# Patient Record
Sex: Female | Born: 1950
Health system: Southern US, Community
[De-identification: ages and names within clinical notes are randomized; demographics above are authoritative.]

## PROBLEM LIST (undated history)

## (undated) DIAGNOSIS — C189 Malignant neoplasm of colon, unspecified: Secondary | ICD-10-CM

## (undated) DIAGNOSIS — E119 Type 2 diabetes mellitus without complications: Secondary | ICD-10-CM

## (undated) DIAGNOSIS — I10 Essential (primary) hypertension: Secondary | ICD-10-CM

## (undated) DIAGNOSIS — E785 Hyperlipidemia, unspecified: Secondary | ICD-10-CM

## (undated) DIAGNOSIS — R002 Palpitations: Secondary | ICD-10-CM

## (undated) HISTORY — DX: Hyperlipidemia, unspecified: E78.5

## (undated) HISTORY — DX: Palpitations: R00.2

## (undated) HISTORY — PX: ABDOMINAL HYSTERECTOMY: SHX81

## (undated) HISTORY — DX: Type 2 diabetes mellitus without complications: E11.9

---

## 2007-08-14 HISTORY — PX: COLON SURGERY: SHX602

## 2007-08-14 HISTORY — PX: ABDOMINAL HERNIA REPAIR: SHX539

## 2009-02-28 ENCOUNTER — Emergency Department (HOSPITAL_COMMUNITY): Admission: EM | Admit: 2009-02-28 | Discharge: 2009-03-01 | Payer: Self-pay | Admitting: Emergency Medicine

## 2009-03-18 ENCOUNTER — Ambulatory Visit (HOSPITAL_COMMUNITY): Admission: RE | Admit: 2009-03-18 | Discharge: 2009-03-18 | Payer: Self-pay | Admitting: Obstetrics and Gynecology

## 2009-07-01 ENCOUNTER — Ambulatory Visit (HOSPITAL_COMMUNITY): Admission: RE | Admit: 2009-07-01 | Discharge: 2009-07-01 | Payer: Self-pay | Admitting: Obstetrics and Gynecology

## 2010-11-19 LAB — URINALYSIS, ROUTINE W REFLEX MICROSCOPIC
Glucose, UA: NEGATIVE mg/dL
Hgb urine dipstick: NEGATIVE
Ketones, ur: NEGATIVE mg/dL
Protein, ur: NEGATIVE mg/dL

## 2010-11-19 LAB — COMPREHENSIVE METABOLIC PANEL
Albumin: 3.9 g/dL (ref 3.5–5.2)
Alkaline Phosphatase: 59 U/L (ref 39–117)
BUN: 13 mg/dL (ref 6–23)
CO2: 25 mEq/L (ref 19–32)
Chloride: 104 mEq/L (ref 96–112)
Glucose, Bld: 102 mg/dL — ABNORMAL HIGH (ref 70–99)
Potassium: 3.7 mEq/L (ref 3.5–5.1)
Total Bilirubin: 0.5 mg/dL (ref 0.3–1.2)

## 2010-11-19 LAB — DIFFERENTIAL
Basophils Absolute: 0 10*3/uL (ref 0.0–0.1)
Basophils Relative: 0 % (ref 0–1)
Monocytes Absolute: 0.4 10*3/uL (ref 0.1–1.0)
Neutro Abs: 4.7 10*3/uL (ref 1.7–7.7)
Neutrophils Relative %: 70 % (ref 43–77)

## 2010-11-19 LAB — CBC
HCT: 40.3 % (ref 36.0–46.0)
Hemoglobin: 13.6 g/dL (ref 12.0–15.0)
RBC: 4.59 MIL/uL (ref 3.87–5.11)
WBC: 6.8 10*3/uL (ref 4.0–10.5)

## 2010-11-19 LAB — URINE MICROSCOPIC-ADD ON

## 2012-11-30 ENCOUNTER — Emergency Department (HOSPITAL_COMMUNITY): Payer: Medicare Other

## 2012-11-30 ENCOUNTER — Emergency Department (HOSPITAL_COMMUNITY)
Admission: EM | Admit: 2012-11-30 | Discharge: 2012-11-30 | Disposition: A | Discharge status: Home / Self Care | Payer: Medicare Other | Attending: Emergency Medicine | Admitting: Emergency Medicine

## 2012-11-30 ENCOUNTER — Encounter (HOSPITAL_COMMUNITY): Payer: Self-pay | Admitting: Emergency Medicine

## 2012-11-30 DIAGNOSIS — E876 Hypokalemia: Secondary | ICD-10-CM

## 2012-11-30 DIAGNOSIS — R42 Dizziness and giddiness: Secondary | ICD-10-CM | POA: Insufficient documentation

## 2012-11-30 DIAGNOSIS — I951 Orthostatic hypotension: Secondary | ICD-10-CM | POA: Insufficient documentation

## 2012-11-30 HISTORY — DX: Essential (primary) hypertension: I10

## 2012-11-30 HISTORY — DX: Malignant neoplasm of colon, unspecified: C18.9

## 2012-11-30 LAB — CBC WITH DIFFERENTIAL/PLATELET
Basophils Relative: 0 % (ref 0–1)
Eosinophils Absolute: 0 10*3/uL (ref 0.0–0.7)
Lymphs Abs: 1.9 10*3/uL (ref 0.7–4.0)
MCH: 29.6 pg (ref 26.0–34.0)
Neutrophils Relative %: 71 % (ref 43–77)
Platelets: 412 10*3/uL — ABNORMAL HIGH (ref 150–400)
RBC: 4.36 MIL/uL (ref 3.87–5.11)

## 2012-11-30 LAB — BASIC METABOLIC PANEL
GFR calc Af Amer: 74 mL/min — ABNORMAL LOW (ref >90)
GFR calc non Af Amer: 64 mL/min — ABNORMAL LOW (ref >90)
Potassium: 3 mEq/L — ABNORMAL LOW (ref 3.5–5.1)
Sodium: 133 mEq/L — ABNORMAL LOW (ref 135–145)

## 2012-11-30 LAB — URINALYSIS, ROUTINE W REFLEX MICROSCOPIC
Nitrite: NEGATIVE
Specific Gravity, Urine: 1.007 (ref 1.005–1.030)
Urobilinogen, UA: 0.2 mg/dL (ref 0.0–1.0)

## 2012-11-30 LAB — URINE MICROSCOPIC-ADD ON

## 2012-11-30 LAB — GLUCOSE, CAPILLARY: Glucose-Capillary: 115 mg/dL — ABNORMAL HIGH (ref 70–99)

## 2012-11-30 MED ORDER — SODIUM CHLORIDE 0.9 % IV BOLUS (SEPSIS)
1000.0000 mL | Freq: Once | INTRAVENOUS | Status: AC
  Administered 2012-11-30: 1000 mL via INTRAVENOUS

## 2012-11-30 MED ORDER — POTASSIUM CHLORIDE CRYS ER 20 MEQ PO TBCR
40.0000 meq | EXTENDED_RELEASE_TABLET | Freq: Once | ORAL | Status: AC
  Administered 2012-11-30: 40 meq via ORAL
  Filled 2012-11-30: qty 2

## 2012-11-30 MED ORDER — ACETAMINOPHEN 325 MG PO TABS
650.0000 mg | ORAL_TABLET | Freq: Once | ORAL | Status: AC
  Administered 2012-11-30: 650 mg via ORAL
  Filled 2012-11-30: qty 2

## 2012-11-30 NOTE — ED Notes (Signed)
Lab called, no label on urinalysis, need to re-collect

## 2012-11-30 NOTE — ED Notes (Signed)
Urine sent to lab 

## 2012-11-30 NOTE — ED Provider Notes (Signed)
History     CSN: [id number]  Arrival date & time 11/30/12  1136   First MD Initiated Contact with Patient 11/30/12 1138      No chief complaint on file.   (Consider location/radiation/quality/duration/timing/severity/associated sxs/prior treatment) HPI  62 year old female presents complaining of dizziness and heart palpitation. Patient reports she has a history of colon cancer and history of hypertension. Last week she was diagnosed with having angioedema secondary to lisinopril use. Her lisinopril was discontinued and she was switched to a diuretic pill, triamterine-HCTZ 37.5-25 PO once daily.  She reports she has been taking this medication for the past 5 days. She reports feeling lightheadedness and nearly passed out this morning when trying to get out of bed. Reports tingling sensation throughout her body, having heart palpitation with significant lightheadedness. Symptoms improved when she lies flat. Patient also reports having some mild burning urination and also having increased urinary frequency urgency. States she urinates a lot, but this is normal for her. She reported she has diabetes that are managed with diet only.  She denies loss of consciousness. Endorse nausea without vomiting or diarrhea. No chest pain, short of breath, abdominal pain, or rash.  No past medical history on file.  No past surgical history on file.  No family history on file.  History  Substance Use Topics  . Smoking status: Not on file  . Smokeless tobacco: Not on file  . Alcohol Use: Not on file    OB History   No data available      Review of Systems  Constitutional:       10 Systems reviewed and all are negative for acute change except as noted in the HPI.     Allergies  Review of patient's allergies indicates not on file.  Home Medications  No current outpatient prescriptions on file.  There were no vitals taken for this visit.  Physical Exam  Nursing note and vitals  reviewed. Constitutional: She is oriented to person, place, and time. She appears well-developed and well-nourished. No distress.  Awake, alert, nontoxic appearance  HENT:  Head: Normocephalic and atraumatic.  Mouth/Throat: Oropharynx is clear and moist.  Eyes: Conjunctivae are normal. Right eye exhibits no discharge. Left eye exhibits no discharge.  Neck: Normal range of motion. Neck supple. No JVD present.  Cardiovascular: Normal rate, regular rhythm and intact distal pulses.  Exam reveals no gallop and no friction rub.   No murmur heard. Pulmonary/Chest: Effort normal. No respiratory distress. She exhibits no tenderness.  Abdominal: Soft. There is no tenderness. There is no rebound.  Well-healing midline abdominal surgical scar, no hernia noted, nontender on palpation.  Musculoskeletal: She exhibits no edema and no tenderness.  ROM appears intact, no obvious focal weakness  Neurological: She is alert and oriented to person, place, and time. She has normal strength. No cranial nerve deficit or sensory deficit. GCS eye subscore is 4. GCS verbal subscore is 5. GCS motor subscore is 6.  Mental status and motor strength appears intact  Skin: No rash noted.  Psychiatric: She has a normal mood and affect.    ED Course  Procedures (including critical care time)   Date: 11/30/2012  Rate: 75  Rhythm: normal sinus rhythm  QRS Axis: left  Intervals: normal  ST/T Wave abnormalities: normal  Conduction Disutrbances:none  Narrative Interpretation:   Old EKG Reviewed: none available    12:13 PM Patient presents with near syncope episode, and heart palpitation. Her current blood pressure is 86/59. Symptoms worsened with  positional change, likely orthostatic hypotension. She has been taking a diuretic pill, which may likely precipitate the near syncope. No vertiginous complaint. Patient has no focal neuro deficit. No active chest pain or shortness of breath. Workup initiated.  1:31 PM BP  improves after IV hydration.  K+ 3.0 today, supplementation given.  Otherwise ECG, troponin, Hgb, CBG and BNP are all normal.  Care discussed with attending.  Plan to continue hydration until pt is back to baseline, able to ambulate.  Will d/c her diuretic and have pt have close f/u with PCP for further management.    Labs Reviewed  CBC WITH DIFFERENTIAL - Abnormal; Notable for the following:    Platelets 412 (*)    All other components within normal limits  BASIC METABOLIC PANEL - Abnormal; Notable for the following:    Sodium 133 (*)    Potassium 3.0 (*)    Chloride 95 (*)    Glucose, Bld 113 (*)    GFR calc non Af Amer 64 (*)    GFR calc Af Amer 74 (*)    All other components within normal limits  URINALYSIS, ROUTINE W REFLEX MICROSCOPIC - Abnormal; Notable for the following:    Leukocytes, UA SMALL (*)    All other components within normal limits  GLUCOSE, CAPILLARY - Abnormal; Notable for the following:    Glucose-Capillary 115 (*)    All other components within normal limits  PRO B NATRIURETIC PEPTIDE  URINE MICROSCOPIC-ADD ON  POCT I-STAT TROPONIN I   Dg Chest 2 View  11/30/2012  *RADIOLOGY REPORT*  Clinical Data:  Heart palpitations, near-syncope  CHEST - 2 VIEW  Comparison: None.  Findings: The lungs are well-aerated and free from pulmonary edema, focal airspace consolidation or pulmonary nodule.  Cardiac and mediastinal contours are within normal limits.  No pneumothorax, or pleural effusion. No acute osseous findings. Helical surgical tacks in the upper abdomen suggest prior laparoscopic ventral hernia repair.  IMPRESSION:  No acute cardiopulmonary disease.   Original Report Authenticated By: Malachy Moan, M.D.      1. Orthostatic hypotension   2. Hypokalemia       MDM  BP 129/56  Pulse 72  Temp(Src) 99.2 F (37.3 C) (Oral)  Resp 24  SpO2 100%  I have reviewed nursing notes and vital signs. I personally reviewed the imaging tests through PACS system  I  reviewed available ER/hospitalization records thought the EMR         Fayrene Helper, [place] 11/30/12 1433

## 2012-11-30 NOTE — ED Notes (Signed)
Pt ambulated to bathroom with assistance.

## 2012-11-30 NOTE — ED Notes (Signed)
Patient reports dizziness, headache, and palpatations since last week when her B/P med was changed.

## 2012-12-04 NOTE — ED Provider Notes (Signed)
Medical screening examination/treatment/procedure(s) were performed by non-physician practitioner and as supervising physician I was immediately available for consultation/collaboration.  Hurman Horn, MD 12/04/12 [phone]

## 2013-10-05 ENCOUNTER — Other Ambulatory Visit: Payer: Self-pay | Admitting: Gastroenterology

## 2013-10-05 DIAGNOSIS — R1032 Left lower quadrant pain: Secondary | ICD-10-CM

## 2013-10-16 ENCOUNTER — Ambulatory Visit
Admission: RE | Admit: 2013-10-16 | Discharge: 2013-10-16 | Disposition: A | Discharge status: Home / Self Care | Payer: Commercial Managed Care - HMO | Source: Ambulatory Visit | Attending: Gastroenterology | Admitting: Gastroenterology

## 2013-10-16 DIAGNOSIS — R1032 Left lower quadrant pain: Secondary | ICD-10-CM

## 2013-10-16 MED ORDER — IOHEXOL 300 MG/ML  SOLN
125.0000 mL | Freq: Once | INTRAMUSCULAR | Status: AC | PRN
  Administered 2013-10-16: 125 mL via INTRAVENOUS

## 2014-05-06 ENCOUNTER — Encounter: Payer: Medicare HMO | Attending: Internal Medicine

## 2014-05-06 VITALS — Ht 63.0 in | Wt 229.3 lb

## 2014-05-06 DIAGNOSIS — Z713 Dietary counseling and surveillance: Secondary | ICD-10-CM | POA: Diagnosis not present

## 2014-05-06 DIAGNOSIS — E119 Type 2 diabetes mellitus without complications: Secondary | ICD-10-CM | POA: Insufficient documentation

## 2014-05-09 NOTE — Progress Notes (Signed)
Patient was seen on 05/06/14 for the first of a series of three diabetes self-management courses at the [facility].  Patient Education Plan per assessed needs and concerns is to attend four course education program for Diabetes Self Management Education.  The following learning objectives were met by the patient during this class:  Describe diabetes  State some common risk factors for diabetes  Defines the role of glucose and insulin  Identifies type of diabetes and pathophysiology  Describe the relationship between diabetes and cardiovascular risk  State the members of the Healthcare Team  States the rationale for glucose monitoring  State when to test glucose  State their individual Target Range  State the importance of logging glucose readings  Describe how to interpret glucose readings  Identifies A1C target  Explain the correlation between A1c and eAG values  State symptoms and treatment of high blood glucose  State symptoms and treatment of low blood glucose  Explain proper technique for glucose testing  Identifies proper sharps disposal  Handouts given during class include:  Living Well with Diabetes book  Carb Counting and Meal Planning book  Meal Plan Card  Carbohydrate guide  Meal planning worksheet  Low Sodium Flavoring Tips  The diabetes portion plate  Z1Q to eAG Conversion Chart  Diabetes Medications  Diabetes Recommended Care Schedule  Support Group  Diabetes Success Plan  Core Class Satisfaction Survey  Follow-Up Plan:  Attend core 2

## 2014-05-13 ENCOUNTER — Ambulatory Visit: Payer: Commercial Managed Care - HMO

## 2014-05-20 ENCOUNTER — Ambulatory Visit: Payer: Commercial Managed Care - HMO

## 2014-06-03 ENCOUNTER — Encounter: Payer: Commercial Managed Care - HMO | Attending: Internal Medicine

## 2014-06-03 DIAGNOSIS — Z713 Dietary counseling and surveillance: Secondary | ICD-10-CM | POA: Insufficient documentation

## 2014-06-03 DIAGNOSIS — E119 Type 2 diabetes mellitus without complications: Secondary | ICD-10-CM | POA: Insufficient documentation

## 2014-06-03 NOTE — Progress Notes (Signed)
Class Time: 9:00 - 11:00  Patient was seen on 06/03/14 for the second of a series of three diabetes self-management courses at the [facility]. The following learning objectives were met by the patient during this class:   Describe the role of different macronutrients on glucose  Explain how carbohydrates affect blood glucose  State what foods contain the most carbohydrates  Demonstrate carbohydrate counting  Demonstrate how to read Nutrition Facts food label  Describe effects of various fats on heart health  Describe the importance of good nutrition for health and healthy eating strategies  Describe techniques for managing your shopping, cooking and meal planning  List strategies to follow meal plan when dining out  Describe the effects of alcohol on glucose and how to use it safely  Goals:  Follow Diabetes Meal Plan as instructed  Eat 3 meals and 2 snacks, every 3-5 hrs  Limit carbohydrate intake to 30-45 grams carbohydrate/meal Limit carbohydrate intake to 15 grams carbohydrate/snack Add lean protein foods to meals/snacks  Monitor glucose levels as instructed by your doctor   Follow-Up Plan:  Attend Core 3  Work towards following your personal food plan.

## 2014-06-10 ENCOUNTER — Ambulatory Visit: Payer: Commercial Managed Care - HMO

## 2014-09-15 DIAGNOSIS — H8110 Benign paroxysmal vertigo, unspecified ear: Secondary | ICD-10-CM | POA: Diagnosis not present

## 2014-09-15 DIAGNOSIS — K210 Gastro-esophageal reflux disease with esophagitis: Secondary | ICD-10-CM | POA: Diagnosis not present

## 2014-09-15 DIAGNOSIS — J310 Chronic rhinitis: Secondary | ICD-10-CM | POA: Diagnosis not present

## 2014-11-15 DIAGNOSIS — E119 Type 2 diabetes mellitus without complications: Secondary | ICD-10-CM | POA: Diagnosis not present

## 2014-11-15 DIAGNOSIS — E782 Mixed hyperlipidemia: Secondary | ICD-10-CM | POA: Diagnosis not present

## 2014-11-15 DIAGNOSIS — I10 Essential (primary) hypertension: Secondary | ICD-10-CM | POA: Diagnosis not present

## 2014-11-22 DIAGNOSIS — I10 Essential (primary) hypertension: Secondary | ICD-10-CM | POA: Diagnosis not present

## 2014-11-22 DIAGNOSIS — E119 Type 2 diabetes mellitus without complications: Secondary | ICD-10-CM | POA: Diagnosis not present

## 2014-11-22 DIAGNOSIS — E782 Mixed hyperlipidemia: Secondary | ICD-10-CM | POA: Diagnosis not present

## 2015-03-21 DIAGNOSIS — R232 Flushing: Secondary | ICD-10-CM | POA: Diagnosis not present

## 2015-03-21 DIAGNOSIS — M25562 Pain in left knee: Secondary | ICD-10-CM | POA: Diagnosis not present

## 2015-03-31 DIAGNOSIS — M25562 Pain in left knee: Secondary | ICD-10-CM | POA: Diagnosis not present

## 2015-03-31 DIAGNOSIS — M1712 Unilateral primary osteoarthritis, left knee: Secondary | ICD-10-CM | POA: Diagnosis not present

## 2015-04-01 DIAGNOSIS — M150 Primary generalized (osteo)arthritis: Secondary | ICD-10-CM | POA: Diagnosis not present

## 2015-04-01 DIAGNOSIS — M25569 Pain in unspecified knee: Secondary | ICD-10-CM | POA: Diagnosis not present

## 2015-04-19 DIAGNOSIS — H25013 Cortical age-related cataract, bilateral: Secondary | ICD-10-CM | POA: Diagnosis not present

## 2015-04-19 DIAGNOSIS — H18413 Arcus senilis, bilateral: Secondary | ICD-10-CM | POA: Diagnosis not present

## 2015-04-19 DIAGNOSIS — H3531 Nonexudative age-related macular degeneration: Secondary | ICD-10-CM | POA: Diagnosis not present

## 2015-04-19 DIAGNOSIS — H11153 Pinguecula, bilateral: Secondary | ICD-10-CM | POA: Diagnosis not present

## 2015-04-19 DIAGNOSIS — H04123 Dry eye syndrome of bilateral lacrimal glands: Secondary | ICD-10-CM | POA: Diagnosis not present

## 2015-04-19 DIAGNOSIS — H2513 Age-related nuclear cataract, bilateral: Secondary | ICD-10-CM | POA: Diagnosis not present

## 2015-04-19 DIAGNOSIS — H5203 Hypermetropia, bilateral: Secondary | ICD-10-CM | POA: Diagnosis not present

## 2015-04-19 DIAGNOSIS — H52222 Regular astigmatism, left eye: Secondary | ICD-10-CM | POA: Diagnosis not present

## 2015-04-19 DIAGNOSIS — E119 Type 2 diabetes mellitus without complications: Secondary | ICD-10-CM | POA: Diagnosis not present

## 2016-02-24 IMAGING — CT CT ABD-PELV W/ CM
3 of 5 series · 12 of 36 positions shown, 18 images · IV contrast (READICAT/WATER & [ID] OMNI 300)
Comparison: DG ABDOMEN 2V dated 08/11/2013; CT ABD W/CM dated
02/28/2009; US TRANSVAGINAL NON-OB dated 03/18/2009

CLINICAL DATA: Left lower quadrant pain. Evaluate for
diverticulitis. History of colon cancer in 3223, status post
chemotherapy and radiation therapy. Diarrhea and constipation.

EXAM:
CT ABDOMEN AND PELVIS WITH CONTRAST
TECHNIQUE: Multidetector CT imaging of the abdomen and pelvis was performed
using the standard protocol following bolus administration of
intravenous contrast.
CONTRAST:  125mL OMNIPAQUE IOHEXOL 300 MG/ML  SOLN

[Series 3: abd/pelvis with · axial · 0.91mm/px · z∈[-273,-8]mm · 6 of 75 slices shown, 11 images]
[im 11/75  soft-tissue]
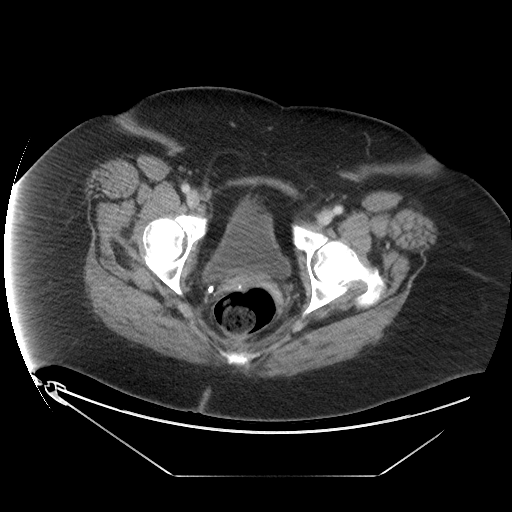
[im 11/75  bone]
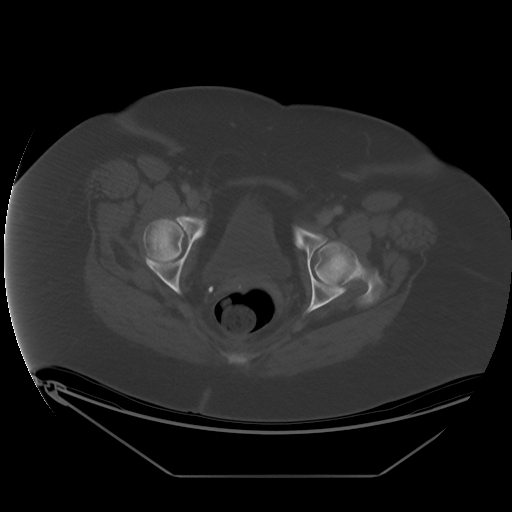
[im 22/75  soft-tissue]
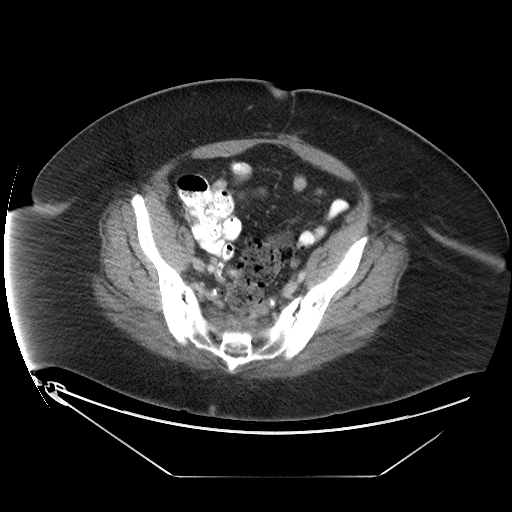
[im 32/75  soft-tissue]
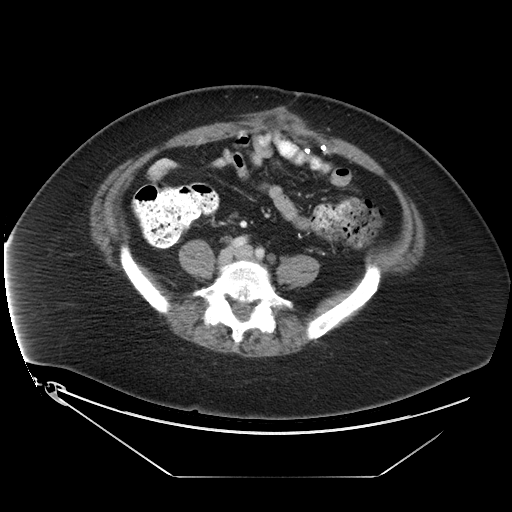
[im 32/75  lung]
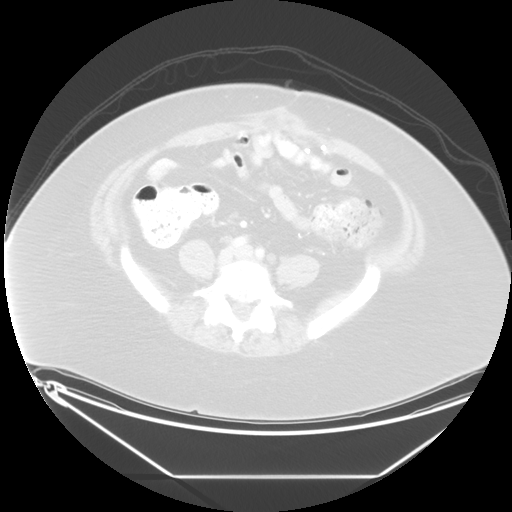
[im 43/75  soft-tissue]
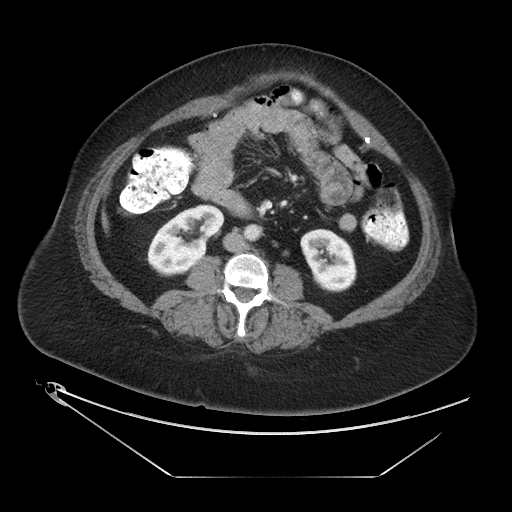
[im 43/75  lung]
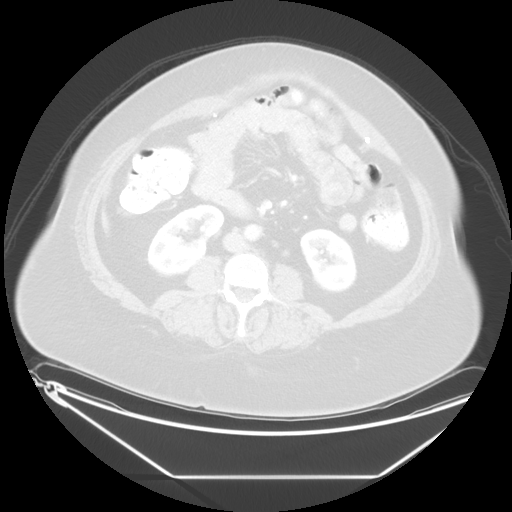
[im 53/75  soft-tissue]
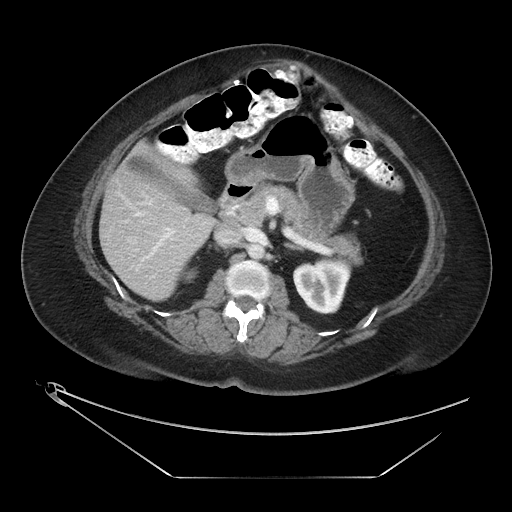
[im 53/75  lung]
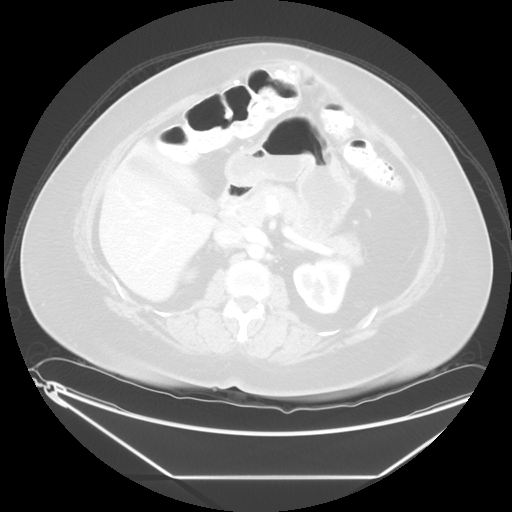
[im 64/75  soft-tissue]
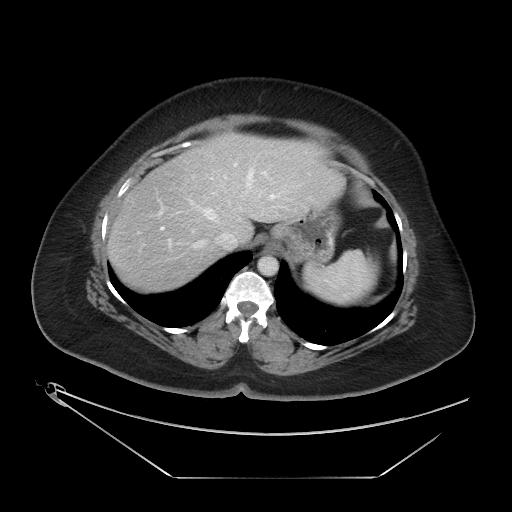
[im 64/75  lung]
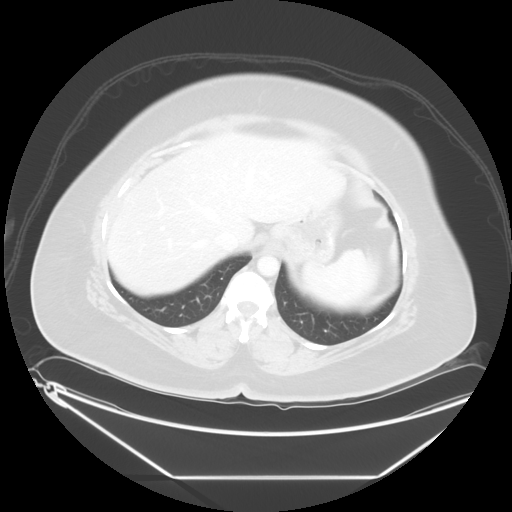

[Series 601: coronal body · coronal · 0.91mm/px · 1 of 142 slices shown, 2 images]
[im 48/142  soft-tissue]
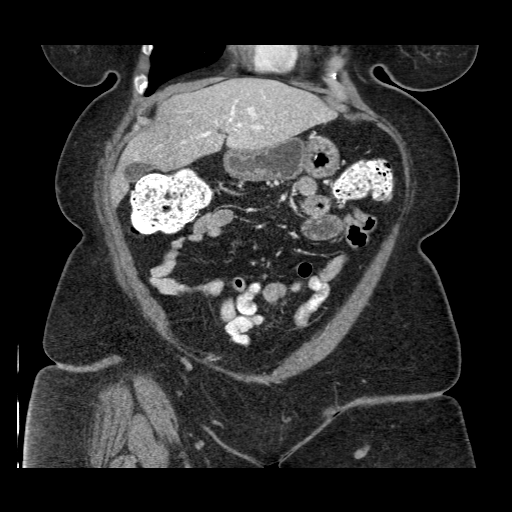
[im 48/142  bone]
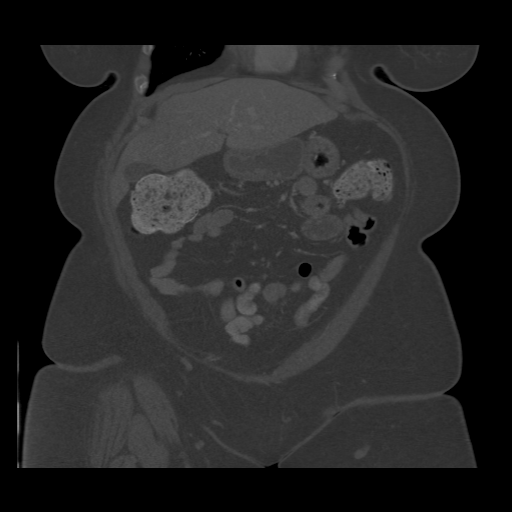

[Series 602: sagittal body · sagittal · 0.91mm/px · 5 of 186 slices shown]
[im 21/186  soft-tissue]
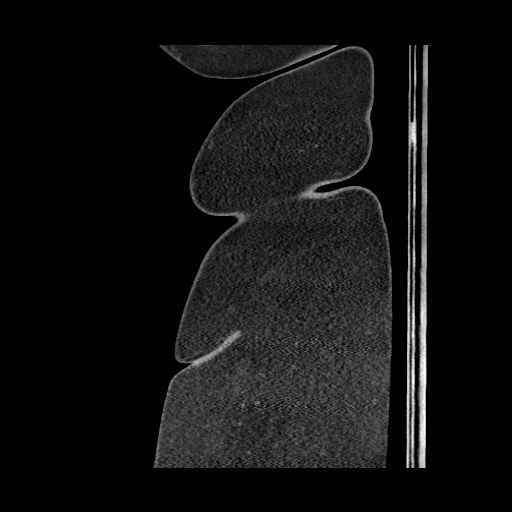
[im 42/186  soft-tissue]
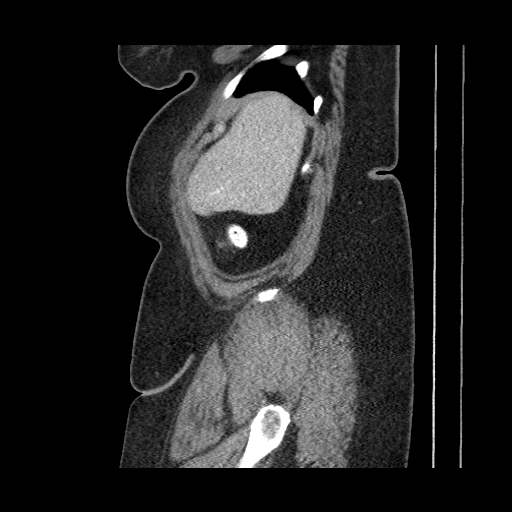
[im 62/186  soft-tissue]
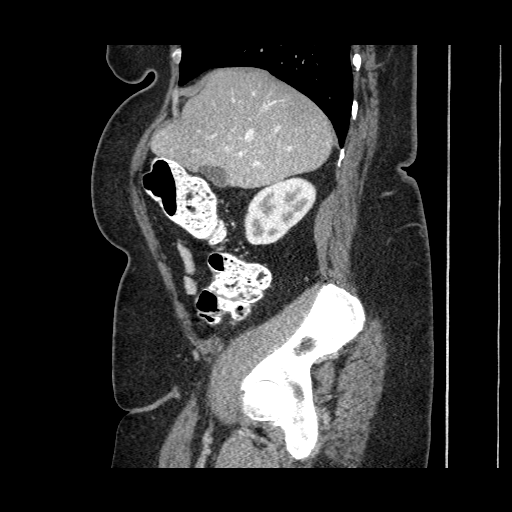
[im 83/186  soft-tissue]
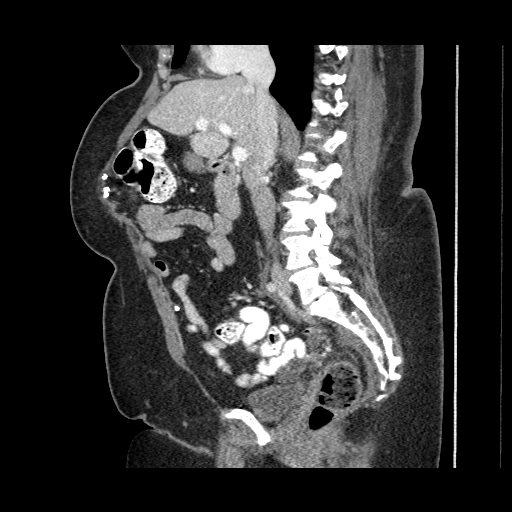
[im 103/186  soft-tissue]
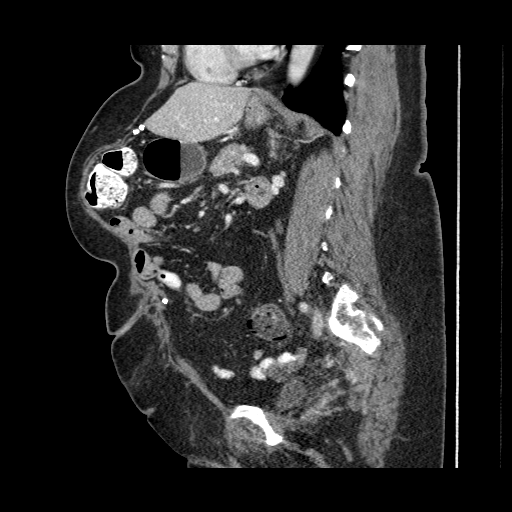

[12 of 36 positions shown; findings below may reference images not displayed]

FINDINGS: Lower Chest: 3 mm right lower lobe lung nodule on image 5 is similar
to the prior exam, consistent with a benign etiology. A calcified
granuloma at the lingula. Mild cardiomegaly, without pericardial or
pleural effusion. A small hiatal hernia.

Abdomen/Pelvis: Normal liver. A splenule. Normal distal stomach,
pancreas, gallbladder, biliary tract, adrenal glands, kidneys.
Aortic and branch vessel atherosclerosis. No retroperitoneal or
retrocrural adenopathy.

Surgical sutures at the rectosigmoid junction. There is a large
amount of colonic stool throughout.

Normal terminal ileum. Small bowel is normal in caliber. Prior
ventral abdominal wall hernia repair. Suspicion of residual
abdominal wall laxity. This contains nonobstructive large and small
bowel. No ascites. No evidence of omental or peritoneal disease. Fat
containing right inguinal hernia. No pelvic adenopathy. Normal
urinary bladder. Hysterectomy.

Right ovarian or adnexal prominence for age. 4.1 cm maximally. This
is similar to decreased since the 7323 exam, suggesting a benign
etiology. No significant free fluid.

Bones/Musculoskeletal: Degenerative changes of the bilateral
sacroiliac joints. Lower lumbar spondylos.
IMPRESSION: 1.  Possible constipation.
2. No evidence of a diverticulitis or other explanation for Pain.
3. Surgical changes in the rectosigmoid region. No evidence of
locally recurrent or metastatic disease.
4. Ventral abdominal wall laxity, containing nonobstructive large
and small bowel.
5. Small hiatal hernia.
6. Similar to decreased prominence of the right ovary/adnexa. On the
ultrasound of 03/18/2009, this was favored to represent
hydrosalpinx.

## 2016-05-09 HISTORY — PX: CARDIAC CATHETERIZATION: SHX172

## 2017-12-16 DIAGNOSIS — M159 Polyosteoarthritis, unspecified: Secondary | ICD-10-CM | POA: Diagnosis not present

## 2017-12-16 DIAGNOSIS — E782 Mixed hyperlipidemia: Secondary | ICD-10-CM | POA: Diagnosis not present

## 2017-12-16 DIAGNOSIS — I10 Essential (primary) hypertension: Secondary | ICD-10-CM | POA: Diagnosis not present

## 2017-12-16 DIAGNOSIS — E119 Type 2 diabetes mellitus without complications: Secondary | ICD-10-CM | POA: Diagnosis not present

## 2018-01-02 ENCOUNTER — Ambulatory Visit: Payer: Medicare Other | Admitting: Cardiovascular Disease

## 2018-01-02 ENCOUNTER — Encounter: Payer: Self-pay | Admitting: Cardiovascular Disease

## 2018-01-02 ENCOUNTER — Ambulatory Visit: Payer: No Typology Code available for payment source | Admitting: Cardiovascular Disease

## 2018-01-02 VITALS — BP 138/76 | HR 73 | Ht 64.0 in | Wt 222.0 lb

## 2018-01-02 DIAGNOSIS — E1169 Type 2 diabetes mellitus with other specified complication: Secondary | ICD-10-CM

## 2018-01-02 DIAGNOSIS — Z6835 Body mass index (BMI) 35.0-35.9, adult: Secondary | ICD-10-CM

## 2018-01-02 DIAGNOSIS — R0602 Shortness of breath: Secondary | ICD-10-CM | POA: Diagnosis not present

## 2018-01-02 DIAGNOSIS — I10 Essential (primary) hypertension: Secondary | ICD-10-CM | POA: Diagnosis not present

## 2018-01-02 DIAGNOSIS — E669 Obesity, unspecified: Secondary | ICD-10-CM

## 2018-01-02 DIAGNOSIS — E7849 Other hyperlipidemia: Secondary | ICD-10-CM | POA: Diagnosis not present

## 2018-01-02 MED ORDER — FUROSEMIDE 80 MG PO TABS: 80.0000 mg | ORAL_TABLET | Freq: Every day | ORAL | 3 refills | Status: DC

## 2018-01-02 MED ORDER — SPIRONOLACTONE 25 MG PO TABS: 25.0000 mg | ORAL_TABLET | Freq: Every day | ORAL | 3 refills | Status: DC

## 2018-01-02 NOTE — Progress Notes (Signed)
Cardiology Consultation Note:    Date:  01/04/2018   ID:  Crystal Holt, Pfahler [birth year], MRN [id number]  PCP:  Merrilee Seashore, MD  Cardiologist:  No primary care provider on file. New  Referring MD: Merrilee Seashore, MD   Chief Complaint  Patient presents with  . Follow-up  . Shortness of Breath  . Headache  . Chest Pain  Crystal Holt is a 67 y.o. female who is being seen today for the evaluation of dyspnea and chest pain at the request of Merrilee Seashore, MD.   History of Present Illness:    Crystal Holt is a 67 y.o. female with a hx of hypertension, hyperlipidemia,Severe obesity and type 2 diabetes mellitus, recently moved to [place] from [place].  Her husband Crystal Holt was my patient in the past.  She had problems with lower extremity edema for more than 3 years.  Recently she has been troubled by difficulty with dyspnea both with activity and at rest.  She sleeps on at least 3 pillows.  She has symptoms strongly suggestive of paroxysmal nocturnal dyspnea.  She sometimes has chest discomfort, but has used nitroglycerin rarely, no more than twice in the last 3 years.  Her leg edema never completely resolves.  She denies focal neurological complaints and intermittent claudication.  She has not experienced syncope and denies dizziness or falls.  She reports having a cardiac catheterization in [place] about a year ago that showed "minor blockage".  Her cardiologist was Dr. Bradly Bienenstock at [facility].  Her diabetes is not well controlled recently, with morning glucose as high as 242.  She takes a statin and aspirin and a beta-blocker.  She has never smoked.  I do not have the most recent labs, but in 2014 her creatinine was normal and her BNP was 42.  In 1999 she had a partial hysterectomy, without oophorectomy.  She believes she is going through menopause over the last 3 or 4 years.  In 2008 she had colon cancer and underwent partial  colectomy, chemotherapy and radiation therapy.  She reports that her mother had to open heart surgeries, but does not know why.  Her grandmother also died from heart problems.  There is a strong family history of hypertension and diabetes but as far she knows know what he has been on dialysis or had kidney failure.  Past Medical History:  Diagnosis Date  . Colon cancer ([place])   . Diabetes mellitus without complication ([place])   . Heart palpitations   . Hyperlipidemia   . Hypertension     Past Surgical History:  Procedure Laterality Date  . ABDOMINAL HYSTERECTOMY    . ABDOMINAL SURGERY    . COLON SURGERY      Current Medications: Current Meds  Medication Sig  . albuterol (PROVENTIL HFA;VENTOLIN HFA) 108 (90 Base) MCG/ACT inhaler Inhale 2 puffs into the lungs every 6 (six) hours as needed for wheezing or shortness of breath.  Marland Kitchen aspirin 81 MG tablet Take 81 mg by mouth daily.  . furosemide (LASIX) 80 MG tablet Take 1 tablet (80 mg total) by mouth daily.  . metFORMIN (GLUCOPHAGE-XR) 500 MG 24 hr tablet Take 500 mg by mouth daily with breakfast.  . metoprolol tartrate (LOPRESSOR) 100 MG tablet Take 100 mg by mouth 2 (two) times daily.  Marland Kitchen omeprazole (PRILOSEC) 40 MG capsule Take 40 mg by mouth daily.  . pravastatin (PRAVACHOL) 40 MG tablet Take 40 mg by mouth daily.  . [DISCONTINUED] furosemide (  LASIX) 40 MG tablet Take 40 mg by mouth daily.     Allergies:   Aspirin and Penicillins   Social History   Socioeconomic History  . Marital status: Married    Spouse name: Not on file  . Number of children: Not on file  . Years of education: Not on file  . Highest education level: Not on file  Occupational History  . Not on file  Social Needs  . Financial resource strain: Not on file  . Food insecurity:    Worry: Not on file    Inability: Not on file  . Transportation needs:    Medical: Not on file    Non-medical: Not on file  Tobacco Use  . Smoking status: Never Smoker  .  Smokeless tobacco: Never Used  Substance and Sexual Activity  . Alcohol use: No  . Drug use: No  . Sexual activity: Never  Lifestyle  . Physical activity:    Days per week: Not on file    Minutes per session: Not on file  . Stress: Not on file  Relationships  . Social connections:    Talks on phone: Not on file    Gets together: Not on file    Attends religious service: Not on file    Active member of club or organization: Not on file    Attends meetings of clubs or organizations: Not on file    Relationship status: Not on file  Other Topics Concern  . Not on file  Social History Narrative  . Not on file     Family History: The patient's family history includes Asthma in her other; Cancer in her other; Diabetes in her other; Heart disease in her other; Hyperlipidemia in her other; Hypertension in her other.  ROS:   Please see the history of present illness.    All other systems reviewed and are negative.  EKGs/Labs/Other Studies Reviewed:    EKG:  EKG is ordered today.  The ekg ordered today demonstrates normal sinus rhythm with relatively low voltage throughout  Recent Labs: No results found for requested labs within last 8760 hours.  Recent Lipid Panel No results found for: CHOL, TRIG, HDL, CHOLHDL, VLDL, LDLCALC, LDLDIRECT  Physical Exam:    VS:  BP 138/76 (BP Location: Left Arm, Patient Position: Sitting, Cuff Size: Large)   Pulse 73   Ht 5\' 4"  (1.626 m)   Wt 222 lb (100.7 kg)   BMI 38.11 kg/m      Wt Readings from Last 3 Encounters:  01/02/18 222 lb (100.7 kg)  05/09/14 229 lb 4.8 oz (104 kg)     GEN: Severely obese, with limited exam; well nourished, well developed in no acute distress HEENT: Normal NECK: Unable to evaluate with confidence, but I believe she has 10-12 cm JVD; No carotid bruits LYMPHATICS: No lymphadenopathy CARDIAC: RRR, no murmurs, rubs, gallops RESPIRATORY:  Clear to auscultation without rales, wheezing or rhonchi  ABDOMEN: Soft,  non-tender, non-distended MUSCULOSKELETAL: 2+ symmetrical hard pitting ankle edema; No deformity  SKIN: Warm and dry NEUROLOGIC:  Alert and oriented x 3 PSYCHIATRIC:  Normal affect   ASSESSMENT:    1. Shortness of breath   2. Essential hypertension   3. Other hyperlipidemia   4. Diabetes mellitus type 2 in obese ([place])   5. Severe obesity (BMI 35.0-35.9 with comorbidity) ([place])    PLAN:    In order of problems listed above:  1. Dyspnea: She clearly describes symptoms of left heart failure and  has physical exam evidence of hypervolemia.  Most likely she has diastolic heart failure.  We will get an echocardiogram and try to retrieve her records from [place].  Increase loop diuretic and add spironolactone low voltage on her ECG could be explained by obesity but should keep the possibility of cardiac amyloidosis in mind.  The echo may help clarify whether we need to pursue that diagnosis. 2. HTN: Fair, but not perfect control.  She probably should be on an angiotensin receptor blocker or ACE inhibitor to avoid the renal complications of diabetes.  We will try to get her old records first 3. HLP: We will get her lipid profile from Dr. Ashby Dawes as well as her most recent hemoglobin A1c 4. DM: Appears to be easy to control on metformin monotherapy. 5. Obesity: Regardless of the results of her work-up, weight loss would be highly beneficial   Medication Adjustments/Labs and Tests Ordered: Current medicines are reviewed at length with the patient today.  Concerns regarding medicines are outlined above.  Orders Placed This Encounter  Procedures  . Basic metabolic panel  . Magnesium  . Pro b natriuretic peptide (BNP)  . EKG 12-Lead  . ECHOCARDIOGRAM COMPLETE   Meds ordered this encounter  Medications  . furosemide (LASIX) 80 MG tablet    Sig: Take 1 tablet (80 mg total) by mouth daily.    Dispense:  90 tablet    Refill:  3  . spironolactone (ALDACTONE) 25 MG tablet    Sig: Take 1  tablet (25 mg total) by mouth daily.    Dispense:  90 tablet    Refill:  3    Patient Instructions  Medication Instructions: Dr Sallyanne Kuster has recommended making the following medication changes: 1. INCREASE Furosemide to 80 mg daily 2. START Spironolactone 25 mg - take 1 tablet daily  Labwork: Your physician recommends that you return for lab work in 59 month.  Testing/Procedures: 1. Echocardiogram - Your physician has requested that you have an echocardiogram. Echocardiography is a painless test that uses sound waves to create images of your heart. It provides your doctor with information about the size and shape of your heart and how well your heart's chambers and valves are working. This procedure takes approximately one hour. There are no restrictions for this procedure. This will be performed at our [facility] location [place] , [place] [address] [phone]  Follow-up: Your physician recommends that you schedule a follow-up appointment in 1 month with a NP/PA.  Dr Sallyanne Kuster recommends that you schedule a follow-up appointment in 3 months.  If you need a refill on your cardiac medications before your next appointment, please call your pharmacy.   Your physician recommends that you weigh, daily, at the same time every day, and in the same amount of clothing. Please record your daily weights on the handout provided and bring it to your next appointment.    Low-Sodium Eating Plan Sodium, which is an element that makes up salt, helps you maintain a healthy balance of fluids in your body. Too much sodium can increase your blood pressure and cause fluid and waste to be held in your body. Your health care provider or dietitian may recommend following this plan if you have high blood pressure (hypertension), kidney disease, liver disease, or heart failure. Eating less sodium can help lower your blood pressure, reduce swelling, and protect your heart, liver, and  kidneys. What are tips for following this plan? General guidelines  Most people  on this plan should limit their sodium intake to 1,500-2,000 mg (milligrams) of sodium each day. Reading food labels  The Nutrition Facts label lists the amount of sodium in one serving of the food. If you eat more than one serving, you must multiply the listed amount of sodium by the number of servings.  Choose foods with less than 140 mg of sodium per serving.  Avoid foods with 300 mg of sodium or more per serving. Shopping  Look for lower-sodium products, often labeled as "low-sodium" or "no salt added."  Always check the sodium content even if foods are labeled as "unsalted" or "no salt added".  Buy fresh foods. ? Avoid canned foods and premade or frozen meals. ? Avoid canned, cured, or processed meats  Buy breads that have less than 80 mg of sodium per slice. Cooking  Eat more home-cooked food and less restaurant, buffet, and fast food.  Avoid adding salt when cooking. Use salt-free seasonings or herbs instead of table salt or sea salt. Check with your health care provider or pharmacist before using salt substitutes.  Cook with plant-based oils, such as canola, sunflower, or olive oil. Meal planning  When eating at a restaurant, ask that your food be prepared with less salt or no salt, if possible.  Avoid foods that contain MSG (monosodium glutamate). MSG is sometimes added to [place] food, bouillon, and some canned foods. What foods are recommended? The items listed may not be a complete list. Talk with your dietitian about what dietary choices are best for you. Grains Low-sodium cereals, including oats, puffed wheat and rice, and shredded wheat. Low-sodium crackers. Unsalted rice. Unsalted pasta. Low-sodium bread. Whole-grain breads and whole-grain pasta. Vegetables Fresh or frozen vegetables. "No salt added" canned vegetables. "No salt added" tomato sauce and paste. Low-sodium or  reduced-sodium tomato and vegetable juice. Fruits Fresh, frozen, or canned fruit. Fruit juice. Meats and other protein foods Fresh or frozen (no salt added) meat, poultry, seafood, and fish. Low-sodium canned tuna and salmon. Unsalted nuts. Dried peas, beans, and lentils without added salt. Unsalted canned beans. Eggs. Unsalted nut butters. Dairy Milk. Soy milk. Cheese that is naturally low in sodium, such as ricotta cheese, fresh mozzarella, or Swiss cheese Low-sodium or reduced-sodium cheese. Cream cheese. Yogurt. Fats and oils Unsalted butter. Unsalted margarine with no trans fat. Vegetable oils such as canola or olive oils. Seasonings and other foods Fresh and dried herbs and spices. Salt-free seasonings. Low-sodium mustard and ketchup. Sodium-free salad dressing. Sodium-free light mayonnaise. Fresh or refrigerated horseradish. Lemon juice. Vinegar. Homemade, reduced-sodium, or low-sodium soups. Unsalted popcorn and pretzels. Low-salt or salt-free chips. What foods are not recommended? The items listed may not be a complete list. Talk with your dietitian about what dietary choices are best for you. Grains Instant hot cereals. Bread stuffing, pancake, and biscuit mixes. Croutons. Seasoned rice or pasta mixes. Noodle soup cups. Boxed or frozen macaroni and cheese. Regular salted crackers. Self-rising flour. Vegetables Sauerkraut, pickled vegetables, and relishes. Olives. [place] fries. Onion rings. Regular canned vegetables (not low-sodium or reduced-sodium). Regular canned tomato sauce and paste (not low-sodium or reduced-sodium). Regular tomato and vegetable juice (not low-sodium or reduced-sodium). Frozen vegetables in sauces. Meats and other protein foods Meat or fish that is salted, canned, smoked, spiced, or pickled. Bacon, ham, sausage, hotdogs, corned beef, chipped beef, packaged lunch meats, salt pork, jerky, pickled herring, anchovies, regular canned tuna, sardines, salted  nuts. Dairy Processed cheese and cheese spreads. Cheese curds. Blue cheese. Feta cheese. String cheese.  Regular cottage cheese. Buttermilk. Canned milk. Fats and oils Salted butter. Regular margarine. Ghee. Bacon fat. Seasonings and other foods Onion salt, garlic salt, seasoned salt, table salt, and sea salt. Canned and packaged gravies. Worcestershire sauce. Tartar sauce. Barbecue sauce. Teriyaki sauce. Soy sauce, including reduced-sodium. Steak sauce. Fish sauce. Oyster sauce. Cocktail sauce. Horseradish that you find on the shelf. Regular ketchup and mustard. Meat flavorings and tenderizers. Bouillon cubes. Hot sauce and Tabasco sauce. Premade or packaged marinades. Premade or packaged taco seasonings. Relishes. Regular salad dressings. Salsa. Potato and tortilla chips. Corn chips and puffs. Salted popcorn and pretzels. Canned or dried soups. Pizza. Frozen entrees and pot pies. Summary  Eating less sodium can help lower your blood pressure, reduce swelling, and protect your heart, liver, and kidneys.  Most people on this plan should limit their sodium intake to 1,500-2,000 mg (milligrams) of sodium each day.  Canned, boxed, and frozen foods are high in sodium. Restaurant foods, fast foods, and pizza are also very high in sodium. You also get sodium by adding salt to food.  Try to cook at home, eat more fresh fruits and vegetables, and eat less fast food, canned, processed, or prepared foods. This information is not intended to replace advice given to you by your health care provider. Make sure you discuss any questions you have with your health care provider. Document Released: 01/19/2002 Document Revised: 07/23/2016 Document Reviewed: 07/23/2016 Elsevier Interactive Patient Education  2018 Benjamin Perez, Sanda Klein, MD  01/04/2018 4:28 PM    [place]

## 2018-01-02 NOTE — Patient Instructions (Signed)
Medication Instructions: Dr Sallyanne Kuster has recommended making the following medication changes: 1. INCREASE Furosemide to 80 mg daily 2. START Spironolactone 25 mg - take 1 tablet daily  Labwork: Your physician recommends that you return for lab work in 102 month.  Testing/Procedures: 1. Echocardiogram - Your physician has requested that you have an echocardiogram. Echocardiography is a painless test that uses sound waves to create images of your heart. It provides your doctor with information about the size and shape of your heart and how well your heart's chambers and valves are working. This procedure takes approximately one hour. There are no restrictions for this procedure. This will be performed at our [facility] location [place] , [place] [address] [phone]  Follow-up: Your physician recommends that you schedule a follow-up appointment in 1 month with a NP/PA.  Dr Sallyanne Kuster recommends that you schedule a follow-up appointment in 3 months.  If you need a refill on your cardiac medications before your next appointment, please call your pharmacy.   Your physician recommends that you weigh, daily, at the same time every day, and in the same amount of clothing. Please record your daily weights on the handout provided and bring it to your next appointment.    Low-Sodium Eating Plan Sodium, which is an element that makes up salt, helps you maintain a healthy balance of fluids in your body. Too much sodium can increase your blood pressure and cause fluid and waste to be held in your body. Your health care provider or dietitian may recommend following this plan if you have high blood pressure (hypertension), kidney disease, liver disease, or heart failure. Eating less sodium can help lower your blood pressure, reduce swelling, and protect your heart, liver, and kidneys. What are tips for following this plan? General guidelines  Most people on this plan should limit  their sodium intake to 1,500-2,000 mg (milligrams) of sodium each day. Reading food labels  The Nutrition Facts label lists the amount of sodium in one serving of the food. If you eat more than one serving, you must multiply the listed amount of sodium by the number of servings.  Choose foods with less than 140 mg of sodium per serving.  Avoid foods with 300 mg of sodium or more per serving. Shopping  Look for lower-sodium products, often labeled as "low-sodium" or "no salt added."  Always check the sodium content even if foods are labeled as "unsalted" or "no salt added".  Buy fresh foods. ? Avoid canned foods and premade or frozen meals. ? Avoid canned, cured, or processed meats  Buy breads that have less than 80 mg of sodium per slice. Cooking  Eat more home-cooked food and less restaurant, buffet, and fast food.  Avoid adding salt when cooking. Use salt-free seasonings or herbs instead of table salt or sea salt. Check with your health care provider or pharmacist before using salt substitutes.  Cook with plant-based oils, such as canola, sunflower, or olive oil. Meal planning  When eating at a restaurant, ask that your food be prepared with less salt or no salt, if possible.  Avoid foods that contain MSG (monosodium glutamate). MSG is sometimes added to [place] food, bouillon, and some canned foods. What foods are recommended? The items listed may not be a complete list. Talk with your dietitian about what dietary choices are best for you. Grains Low-sodium cereals, including oats, puffed wheat and rice, and shredded wheat. Low-sodium crackers. Unsalted rice. Unsalted pasta. Low-sodium bread.  Whole-grain breads and whole-grain pasta. Vegetables Fresh or frozen vegetables. "No salt added" canned vegetables. "No salt added" tomato sauce and paste. Low-sodium or reduced-sodium tomato and vegetable juice. Fruits Fresh, frozen, or canned fruit. Fruit juice. Meats and other  protein foods Fresh or frozen (no salt added) meat, poultry, seafood, and fish. Low-sodium canned tuna and salmon. Unsalted nuts. Dried peas, beans, and lentils without added salt. Unsalted canned beans. Eggs. Unsalted nut butters. Dairy Milk. Soy milk. Cheese that is naturally low in sodium, such as ricotta cheese, fresh mozzarella, or Swiss cheese Low-sodium or reduced-sodium cheese. Cream cheese. Yogurt. Fats and oils Unsalted butter. Unsalted margarine with no trans fat. Vegetable oils such as canola or olive oils. Seasonings and other foods Fresh and dried herbs and spices. Salt-free seasonings. Low-sodium mustard and ketchup. Sodium-free salad dressing. Sodium-free light mayonnaise. Fresh or refrigerated horseradish. Lemon juice. Vinegar. Homemade, reduced-sodium, or low-sodium soups. Unsalted popcorn and pretzels. Low-salt or salt-free chips. What foods are not recommended? The items listed may not be a complete list. Talk with your dietitian about what dietary choices are best for you. Grains Instant hot cereals. Bread stuffing, pancake, and biscuit mixes. Croutons. Seasoned rice or pasta mixes. Noodle soup cups. Boxed or frozen macaroni and cheese. Regular salted crackers. Self-rising flour. Vegetables Sauerkraut, pickled vegetables, and relishes. Olives. [place] fries. Onion rings. Regular canned vegetables (not low-sodium or reduced-sodium). Regular canned tomato sauce and paste (not low-sodium or reduced-sodium). Regular tomato and vegetable juice (not low-sodium or reduced-sodium). Frozen vegetables in sauces. Meats and other protein foods Meat or fish that is salted, canned, smoked, spiced, or pickled. Bacon, ham, sausage, hotdogs, corned beef, chipped beef, packaged lunch meats, salt pork, jerky, pickled herring, anchovies, regular canned tuna, sardines, salted nuts. Dairy Processed cheese and cheese spreads. Cheese curds. Blue cheese. Feta cheese. String cheese. Regular cottage  cheese. Buttermilk. Canned milk. Fats and oils Salted butter. Regular margarine. Ghee. Bacon fat. Seasonings and other foods Onion salt, garlic salt, seasoned salt, table salt, and sea salt. Canned and packaged gravies. Worcestershire sauce. Tartar sauce. Barbecue sauce. Teriyaki sauce. Soy sauce, including reduced-sodium. Steak sauce. Fish sauce. Oyster sauce. Cocktail sauce. Horseradish that you find on the shelf. Regular ketchup and mustard. Meat flavorings and tenderizers. Bouillon cubes. Hot sauce and Tabasco sauce. Premade or packaged marinades. Premade or packaged taco seasonings. Relishes. Regular salad dressings. Salsa. Potato and tortilla chips. Corn chips and puffs. Salted popcorn and pretzels. Canned or dried soups. Pizza. Frozen entrees and pot pies. Summary  Eating less sodium can help lower your blood pressure, reduce swelling, and protect your heart, liver, and kidneys.  Most people on this plan should limit their sodium intake to 1,500-2,000 mg (milligrams) of sodium each day.  Canned, boxed, and frozen foods are high in sodium. Restaurant foods, fast foods, and pizza are also very high in sodium. You also get sodium by adding salt to food.  Try to cook at home, eat more fresh fruits and vegetables, and eat less fast food, canned, processed, or prepared foods. This information is not intended to replace advice given to you by your health care provider. Make sure you discuss any questions you have with your health care provider. Document Released: 01/19/2002 Document Revised: 07/23/2016 Document Reviewed: 07/23/2016 Elsevier Interactive Patient Education  [employer].

## 2018-01-04 ENCOUNTER — Encounter: Payer: Self-pay | Admitting: Cardiovascular Disease

## 2018-01-04 DIAGNOSIS — Z6836 Body mass index (BMI) 36.0-36.9, adult: Secondary | ICD-10-CM

## 2018-01-04 DIAGNOSIS — E1169 Type 2 diabetes mellitus with other specified complication: Secondary | ICD-10-CM | POA: Insufficient documentation

## 2018-01-04 DIAGNOSIS — I10 Essential (primary) hypertension: Secondary | ICD-10-CM | POA: Insufficient documentation

## 2018-01-04 DIAGNOSIS — E7800 Pure hypercholesterolemia, unspecified: Secondary | ICD-10-CM | POA: Insufficient documentation

## 2018-01-04 DIAGNOSIS — E669 Obesity, unspecified: Secondary | ICD-10-CM | POA: Insufficient documentation

## 2018-01-09 ENCOUNTER — Other Ambulatory Visit (HOSPITAL_COMMUNITY): Payer: Medicare Other

## 2018-01-14 ENCOUNTER — Other Ambulatory Visit (HOSPITAL_COMMUNITY): Payer: Medicare Other

## 2018-01-17 ENCOUNTER — Ambulatory Visit (HOSPITAL_COMMUNITY): Payer: Medicare Other | Attending: Internal Medicine

## 2018-01-17 ENCOUNTER — Other Ambulatory Visit: Payer: Self-pay

## 2018-01-17 DIAGNOSIS — R0602 Shortness of breath: Secondary | ICD-10-CM

## 2018-01-17 DIAGNOSIS — I5189 Other ill-defined heart diseases: Secondary | ICD-10-CM | POA: Insufficient documentation

## 2018-02-03 ENCOUNTER — Ambulatory Visit (INDEPENDENT_AMBULATORY_CARE_PROVIDER_SITE_OTHER): Payer: Medicare Other | Admitting: Physician Assistant

## 2018-02-03 ENCOUNTER — Encounter: Payer: Self-pay | Admitting: Physician Assistant

## 2018-02-03 VITALS — BP 139/73 | HR 63 | Ht 64.0 in | Wt 216.0 lb

## 2018-02-03 DIAGNOSIS — I5032 Chronic diastolic (congestive) heart failure: Secondary | ICD-10-CM

## 2018-02-03 DIAGNOSIS — I2510 Atherosclerotic heart disease of native coronary artery without angina pectoris: Secondary | ICD-10-CM

## 2018-02-03 DIAGNOSIS — I10 Essential (primary) hypertension: Secondary | ICD-10-CM

## 2018-02-03 DIAGNOSIS — G4730 Sleep apnea, unspecified: Secondary | ICD-10-CM

## 2018-02-03 DIAGNOSIS — R0602 Shortness of breath: Secondary | ICD-10-CM | POA: Diagnosis not present

## 2018-02-03 DIAGNOSIS — R6889 Other general symptoms and signs: Secondary | ICD-10-CM | POA: Diagnosis not present

## 2018-02-03 LAB — PRO B NATRIURETIC PEPTIDE: NT-Pro BNP: 169 pg/mL (ref 0–301)

## 2018-02-03 LAB — BASIC METABOLIC PANEL
BUN / CREAT RATIO: 12 (ref 12–28)
BUN: 13 mg/dL (ref 8–27)
CHLORIDE: 98 mmol/L (ref 96–106)
CO2: 24 mmol/L (ref 20–29)
CREATININE: 1.11 mg/dL — AB (ref 0.57–1.00)
Calcium: 10 mg/dL (ref 8.7–10.3)
GFR, EST AFRICAN AMERICAN: 59 mL/min/{1.73_m2} — AB (ref >59)
GFR, EST NON AFRICAN AMERICAN: 51 mL/min/{1.73_m2} — AB (ref >59)
Glucose: 146 mg/dL — ABNORMAL HIGH (ref 65–99)
Potassium: 3.9 mmol/L (ref 3.5–5.2)
SODIUM: 139 mmol/L (ref 134–144)

## 2018-02-03 LAB — MAGNESIUM: MAGNESIUM: 1.9 mg/dL (ref 1.6–2.3)

## 2018-02-03 NOTE — Patient Instructions (Addendum)
Medication Instructions:   Your physician recommends that you continue on your current medications as directed. Please refer to the Current Medication list given to you today.   If you need a refill on your cardiac medications before your next appointment, please call your pharmacy.  Labwork:  BMET MAG AND BNP TODAY    Testing/Procedures: Your physician has recommended that you have a sleep study. This test records several body functions during sleep, including: brain activity, eye movement, oxygen and carbon dioxide blood levels, heart rate and rhythm, breathing rate and rhythm, the flow of air through your mouth and nose, snoring, body muscle movements, and chest and belly movement. SOMEONE WILL CONTACT YOU BACK WITH FURTHER STEPS.    Follow-Up:  AS SCHEUDLED    Any Other Special Instructions Will Be Listed Below (If Applicable).  Limit sodium intake to 500 mg per week   Make sure you weight yourself daily contact office if weight gain 3lbs in a day 5lbs in a week  Drunk at least 1.5 liter of all fluids with a day

## 2018-02-03 NOTE — Progress Notes (Signed)
Cardiology Office Note   Date:  02/03/2018   ID:  Crystal, Cutbirth Holt 14, 1952, MRN [id number]  PCP:  Crystal Seashore, MD  Cardiologist: Dr. Sallyanne Holt, 01/02/2018 Crystal Ferries, PA-C    History of Present Illness: Crystal Holt is a 67 y.o. female with a history of HTN, HLD, DM, severe obesity, LE edema, colon CA w/ subsequent ventral hernia repair  5/23 office visit, patient complaining of lower extremity edema and dyspnea on exertion, described PND, echo ordered, records from [place] needed, Lasix and Spironolactone increased, consider cardiac amyloid, get lipid profile and A1c from her PCP, once records reviewed, decide on ACE or ARB  Crystal Holt presents for cardiology follow up.  She moved back to [place] in January.   She has lived in [place], had a stress test about 3 years ago. It was abnl>>heart cath in [facility], [place]. Dr Crystal Holt told her no stent needed. "a little blockage".  She is relieved to learn that her echo was ok.  The swelling has improved. She is also watching what she eats better.  She has mostly daytime edema.  Her breathing has improved. She is sleeping better. She still describes orthopnea and PND but not as bad.  She does not have this every night.   She still snores, but not as bad. Her husband does not hear her quit breathing anymore. He used to hear her quit breathing when they lived in [place].   Her dyspnea on exertion. is chronic and has not changed recently.  She does note a significant bendopnea.  When she had colon cancer, after the colon surgery, she had to have her hernia repaired.  She is aware that she has a lot of scar tissue in her abdomen and wonders if this is contributing to her shortness of breath whenever she bends over.   Past Medical History:  Diagnosis Date  . Colon cancer ([place])   . Diabetes mellitus without complication ([place])   . Heart palpitations   . Hyperlipidemia   . Hypertension     Past Surgical History:    Procedure Laterality Date  . ABDOMINAL HERNIA REPAIR  2009  . ABDOMINAL HYSTERECTOMY    . CARDIAC CATHETERIZATION  05/09/2016   By Crystal Holt in [place], LAD 40%, CFX diffuse irregularities, LVEDP 14, medical therapy  . COLON SURGERY  2009    Current Outpatient Medications  Medication Sig Dispense Refill  . albuterol (PROVENTIL HFA;VENTOLIN HFA) 108 (90 Base) MCG/ACT inhaler Inhale 2 puffs into the lungs every 6 (six) hours as needed for wheezing or shortness of breath.    Marland Kitchen aspirin 81 MG tablet Take 81 mg by mouth daily.    . furosemide (LASIX) 80 MG tablet Take 1 tablet (80 mg total) by mouth daily. 90 tablet 3  . metFORMIN (GLUCOPHAGE-XR) 500 MG 24 hr tablet Take 500 mg by mouth daily with breakfast.    . metoprolol tartrate (LOPRESSOR) 100 MG tablet Take 100 mg by mouth 2 (two) times daily.    Marland Kitchen omeprazole (PRILOSEC) 40 MG capsule Take 40 mg by mouth daily.  3  . pravastatin (PRAVACHOL) 40 MG tablet Take 40 mg by mouth daily.    Marland Kitchen spironolactone (ALDACTONE) 25 MG tablet Take 1 tablet (25 mg total) by mouth daily. 90 tablet 3   No current facility-administered medications for this visit.     Allergies:   Aspirin and Penicillins    Social History:  The patient  reports  that she has never smoked. She has never used smokeless tobacco. She reports that she does not drink alcohol or use drugs.   Family History:  The patient's family history includes Asthma in her other; Cancer in her other; Diabetes in her other; Heart disease in her other; Hyperlipidemia in her other; Hypertension in her other.    ROS:  Please see the history of present illness. All other systems are reviewed and negative.    PHYSICAL EXAM: VS:  BP 139/73   Pulse 63   Ht 5\' 4"  (1.626 m)   Wt 216 lb (98 kg)   BMI 37.08 kg/m  , BMI Body mass index is 37.08 kg/m. GEN: Well nourished, well developed, obese female in no acute distress  HEENT: normal for age  Neck: no JVD seen but difficult to  assess secondary to body habitus, no carotid bruit, no masses Cardiac: RRR; soft murmur, no rubs, or gallops Respiratory:  clear to auscultation bilaterally, normal work of breathing GI: soft, nontender, nondistended, + BS MS: no deformity or atrophy; no edema; distal pulses are 2+ in all 4 extremities   Skin: warm and dry, no rash Neuro:  Strength and sensation are intact Psych: euthymic mood, full affect   EKG:  EKG is not ordered today.   ECHO: 01/17/2018 - Left ventricle: The cavity size was normal. Wall thickness was   increased in a pattern of mild LVH. Systolic function was normal.   The estimated ejection fraction was in the range of 60% to 65%.   GLS -18.1%. Wall motion was normal; there were no regional wall   motion abnormalities. Doppler parameters are consistent with   abnormal left ventricular relaxation (grade 1 diastolic   dysfunction). The E/e&' ratio is between 8-15, suggesting   indeterminate LV filling pressure. - Mitral valve: Mildly thickened leaflets . There was trivial   regurgitation. - Left atrium: The atrium was normal in size. - Tricuspid valve: There was trivial regurgitation. - Pulmonary arteries: PA peak pressure: 28 mm Hg (S). - Inferior vena cava: The vessel was normal in size. The   respirophasic diameter changes were in the normal range (= 50%),   consistent with normal central venous pressure. Impressions: - LVEF 60-65%, mild LVH, normal wall motion, grade 1 DD,   indeterminate LV filling pressure, trivial MR, normal LA size,   trivial TR, RVSP 28 mmHg, normal IVC.  Recent Labs: 02/03/2018: BUN 13; Creatinine, Ser 1.11; Magnesium 1.9; NT-Pro BNP 169; Potassium 3.9; Sodium 139    Lipid Panel No results found for: CHOL, TRIG, HDL, CHOLHDL, VLDL, LDLCALC, LDLDIRECT   Wt Readings from Last 3 Encounters:  02/03/18 216 lb (98 kg)  01/02/18 222 lb (100.7 kg)  05/09/14 229 lb 4.8 oz (104 kg)     Other studies Reviewed: Additional studies/  records that were reviewed today include: Office notes, hospital records and testing.  ASSESSMENT AND PLAN:  1.  CAD: Records were obtained from her cardiologist in [place].  She had a heart catheterization 04/2016.  She had a 40% proximal LAD with diffuse irregularities in the circumflex and no other significant disease, medical therapy. -Continue aspirin, beta-blocker and statin.  2.  Chronic diastolic CHF: Her weight is down 6 pounds from her previous visit.  She is breathing much better.  Continue Lasix 80 mg daily.  Follow-up on labs previously ordered and draw them today. -Compliance with daily weights, low-sodium diet and limiting all liquids to 2 L daily was emphasized.  3.  Hypertension: Her blood pressure is up a little today, but she feels it has not been running this high generally.  No med changes for now.  4.  Possible sleep apnea: Her husband used to hear her quit breathing, but that has improved.  She also used to have problems with PND, and that has improved.  However, she filled out the Epworth Sleepiness Scale and her score was high at 14.  Obtain sleep study.  Current medicines are reviewed at length with the patient today.  The patient does not have concerns regarding medicines.  The following changes have been made:  no change  Labs/ tests ordered today include:   Orders Placed This Encounter  Procedures  . Split night study     Disposition:   FU with Dr. Sallyanne Holt  Signed, Crystal Ferries, PA-C  02/03/2018 5:10 PM    [facility] Phone: [phone]; Fax: [phone]  This note was written with the assistance of speech recognition software. Please excuse any transcriptional errors.

## 2018-02-06 ENCOUNTER — Telehealth: Payer: Self-pay | Admitting: *Deleted

## 2018-02-06 NOTE — Telephone Encounter (Signed)
-----   Message from Freada Bergeron, [place] sent at 02/03/2018 12:18 PM EDT ----- Regarding: FW: PT NEEDS TO BE PRECERT AND SET UP FOR SLEEP STUDY   ----- Message ----- From: Claude Manges, CMA Sent: 02/03/2018  11:20 AM To: Lauralee Evener, CMA, Freada Bergeron, CMA Subject: PT NEEDS TO BE PRECERT AND SET UP FOR SLEEP #

## 2018-02-06 NOTE — Telephone Encounter (Signed)
Patient notified of sleep study appointment scheduled for 03/05/18. Per [facility] web portal no pre cert required.

## 2018-02-06 NOTE — Progress Notes (Signed)
Glad she feels better. No real CAD. Sleep study scheduled. Thank you MCr

## 2018-02-18 DIAGNOSIS — E782 Mixed hyperlipidemia: Secondary | ICD-10-CM | POA: Diagnosis not present

## 2018-02-18 DIAGNOSIS — I10 Essential (primary) hypertension: Secondary | ICD-10-CM | POA: Diagnosis not present

## 2018-02-18 DIAGNOSIS — E119 Type 2 diabetes mellitus without complications: Secondary | ICD-10-CM | POA: Diagnosis not present

## 2018-02-24 DIAGNOSIS — I10 Essential (primary) hypertension: Secondary | ICD-10-CM | POA: Diagnosis not present

## 2018-02-24 DIAGNOSIS — R600 Localized edema: Secondary | ICD-10-CM | POA: Diagnosis not present

## 2018-02-24 DIAGNOSIS — E782 Mixed hyperlipidemia: Secondary | ICD-10-CM | POA: Diagnosis not present

## 2018-02-24 DIAGNOSIS — E118 Type 2 diabetes mellitus with unspecified complications: Secondary | ICD-10-CM | POA: Diagnosis not present

## 2018-02-24 DIAGNOSIS — R6889 Other general symptoms and signs: Secondary | ICD-10-CM | POA: Diagnosis not present

## 2018-02-24 DIAGNOSIS — N390 Urinary tract infection, site not specified: Secondary | ICD-10-CM | POA: Diagnosis not present

## 2018-02-24 DIAGNOSIS — E1165 Type 2 diabetes mellitus with hyperglycemia: Secondary | ICD-10-CM | POA: Diagnosis not present

## 2018-03-05 ENCOUNTER — Encounter (HOSPITAL_BASED_OUTPATIENT_CLINIC_OR_DEPARTMENT_OTHER): Payer: Medicare Other

## 2018-04-11 ENCOUNTER — Ambulatory Visit: Payer: Medicare Other | Admitting: Cardiovascular Disease

## 2018-05-26 ENCOUNTER — Ambulatory Visit: Payer: Medicare Other | Admitting: Cardiovascular Disease

## 2018-06-11 DIAGNOSIS — I10 Essential (primary) hypertension: Secondary | ICD-10-CM | POA: Diagnosis not present

## 2018-06-11 DIAGNOSIS — E782 Mixed hyperlipidemia: Secondary | ICD-10-CM | POA: Diagnosis not present

## 2018-06-11 DIAGNOSIS — E1165 Type 2 diabetes mellitus with hyperglycemia: Secondary | ICD-10-CM | POA: Diagnosis not present

## 2018-06-18 DIAGNOSIS — E118 Type 2 diabetes mellitus with unspecified complications: Secondary | ICD-10-CM | POA: Diagnosis not present

## 2018-06-18 DIAGNOSIS — E1121 Type 2 diabetes mellitus with diabetic nephropathy: Secondary | ICD-10-CM | POA: Diagnosis not present

## 2018-06-18 DIAGNOSIS — Z0000 Encounter for general adult medical examination without abnormal findings: Secondary | ICD-10-CM | POA: Diagnosis not present

## 2018-06-18 DIAGNOSIS — I10 Essential (primary) hypertension: Secondary | ICD-10-CM | POA: Diagnosis not present

## 2018-06-18 DIAGNOSIS — E1165 Type 2 diabetes mellitus with hyperglycemia: Secondary | ICD-10-CM | POA: Diagnosis not present

## 2018-06-18 DIAGNOSIS — Z23 Encounter for immunization: Secondary | ICD-10-CM | POA: Diagnosis not present

## 2018-06-18 DIAGNOSIS — E782 Mixed hyperlipidemia: Secondary | ICD-10-CM | POA: Diagnosis not present

## 2018-06-23 ENCOUNTER — Ambulatory Visit (INDEPENDENT_AMBULATORY_CARE_PROVIDER_SITE_OTHER): Payer: Medicare Other | Admitting: Cardiovascular Disease

## 2018-06-23 ENCOUNTER — Encounter: Payer: Self-pay | Admitting: Cardiovascular Disease

## 2018-06-23 VITALS — BP 122/70 | HR 68 | Ht 64.0 in | Wt 210.4 lb

## 2018-06-23 DIAGNOSIS — I5032 Chronic diastolic (congestive) heart failure: Secondary | ICD-10-CM | POA: Diagnosis not present

## 2018-06-23 DIAGNOSIS — E1169 Type 2 diabetes mellitus with other specified complication: Secondary | ICD-10-CM

## 2018-06-23 DIAGNOSIS — Z79899 Other long term (current) drug therapy: Secondary | ICD-10-CM

## 2018-06-23 DIAGNOSIS — I10 Essential (primary) hypertension: Secondary | ICD-10-CM

## 2018-06-23 DIAGNOSIS — E7800 Pure hypercholesterolemia, unspecified: Secondary | ICD-10-CM | POA: Diagnosis not present

## 2018-06-23 DIAGNOSIS — E669 Obesity, unspecified: Secondary | ICD-10-CM

## 2018-06-23 DIAGNOSIS — Z6836 Body mass index (BMI) 36.0-36.9, adult: Secondary | ICD-10-CM

## 2018-06-23 MED ORDER — FUROSEMIDE 80 MG PO TABS: 120.0000 mg | ORAL_TABLET | Freq: Every day | ORAL | 3 refills | Status: DC

## 2018-06-23 MED ORDER — SPIRONOLACTONE 25 MG PO TABS: 37.5000 mg | ORAL_TABLET | Freq: Every day | ORAL | 3 refills | Status: DC

## 2018-06-23 NOTE — Progress Notes (Signed)
Cardiology Consultation Note:    Date:  06/24/2018   ID:  [name], [name] 06-23-1951, MRN [id number]  PCP:  Merrilee Seashore, MD  Cardiologist:  Sanda Klein, MD [place]  Referring MD: Merrilee Seashore, MD   No chief complaint on file. Crystal Holt is a 67 y.o. female who is being seen today for the evaluation of dyspnea and chest pain at the request of Merrilee Seashore, MD.   History of Present Illness:    Crystal Holt is a 67 y.o. female with a hx of diastolic heart failure, hypertension, hyperlipidemia, severe obesity and type 2 diabetes mellitus, recently moved to [place] from [place].  Her husband Broadus John is also my patient.  She denies major problems with exertional dyspnea, but is relatively sedentary.  I think she might be describing paroxysmal nocturnal dyspnea.  She wakes up every night around 4 AM and has to prop herself up on about 3 pillows or moved to a recliner to sleep for the rest of the night.  She has lower extremity edema that resolves after overnight sleep, but recurs every evening.  She states that the reason she cannot sleep at night she is very worried about Joseph's health and his breathing problems.  She has not had angina, palpitations, dizziness, falls, syncope or focal neurological events.  Her weight today is 12 pounds less than it was 6 months ago.  Blood pressure control has been excellent.  She reports having a cardiac catheterization in [place] about a year ago that showed "minor blockage".  Her cardiologist was Dr. Bradly Bienenstock at [place] [place] control has been mediocre.In 1999 she had a partial hysterectomy, without oophorectomy.  In 2008 she had colon cancer and underwent partial colectomy, chemotherapy and radiation therapy.  She reports that her mother had 2 open heart surgeries, but does not know why.  Her grandmother also died from heart problems.  There is a strong family history of  hypertension and diabetes but as far she knows know what he has been on dialysis or had kidney failure.  Past Medical History:  Diagnosis Date  . Colon cancer ([place])   . Diabetes mellitus without complication ([place])   . Heart palpitations   . Hyperlipidemia   . Hypertension     Past Surgical History:  Procedure Laterality Date  . ABDOMINAL HERNIA REPAIR  2009  . ABDOMINAL HYSTERECTOMY    . CARDIAC CATHETERIZATION  05/09/2016   By Phillips Climes in [place], LAD 40%, CFX diffuse irregularities, LVEDP 14, medical therapy  . COLON SURGERY  2009    Current Medications: Current Meds  Medication Sig  . albuterol (PROVENTIL HFA;VENTOLIN HFA) 108 (90 Base) MCG/ACT inhaler Inhale 2 puffs into the lungs every 6 (six) hours as needed for wheezing or shortness of breath.  Marland Kitchen aspirin 81 MG tablet Take 81 mg by mouth daily.  . furosemide (LASIX) 80 MG tablet Take 1.5 tablets (120 mg total) by mouth daily.  . metFORMIN (GLUCOPHAGE-XR) 500 MG 24 hr tablet Take 500 mg by mouth daily with breakfast.  . metoprolol tartrate (LOPRESSOR) 100 MG tablet Take 100 mg by mouth 2 (two) times daily.  Marland Kitchen omeprazole (PRILOSEC) 40 MG capsule Take 40 mg by mouth daily.  . pravastatin (PRAVACHOL) 40 MG tablet Take 40 mg by mouth daily.  . [DISCONTINUED] furosemide (LASIX) 80 MG tablet Take 1 tablet (80 mg total) by mouth daily.     Allergies:   Aspirin  and Penicillins   Social History   Socioeconomic History  . Marital status: Married    Spouse name: Not on file  . Number of children: Not on file  . Years of education: Not on file  . Highest education level: Not on file  Occupational History  . Not on file  Social Needs  . Financial resource strain: Not on file  . Food insecurity:    Worry: Not on file    Inability: Not on file  . Transportation needs:    Medical: Not on file    Non-medical: Not on file  Tobacco Use  . Smoking status: Never Smoker  . Smokeless tobacco: Never Used    Substance and Sexual Activity  . Alcohol use: No  . Drug use: No  . Sexual activity: Never  Lifestyle  . Physical activity:    Days per week: Not on file    Minutes per session: Not on file  . Stress: Not on file  Relationships  . Social connections:    Talks on phone: Not on file    Gets together: Not on file    Attends religious service: Not on file    Active member of club or organization: Not on file    Attends meetings of clubs or organizations: Not on file    Relationship status: Not on file  Other Topics Concern  . Not on file  Social History Narrative  . Not on file     Family History: The patient's family history includes Asthma in her other; Cancer in her other; Diabetes in her other; Heart disease in her other; Hyperlipidemia in her other; Hypertension in her other.  ROS:   Please see the history of present illness.    All other systems reviewed and are negative.  EKGs/Labs/Other Studies Reviewed:    ECHO January 17, 2018 - Left ventricle: The cavity size was normal. Wall thickness was   increased in a pattern of mild LVH. Systolic function was normal.   The estimated ejection fraction was in the range of 60% to 65%.   GLS -18.1%. Wall motion was normal; there were no regional wall   motion abnormalities. Doppler parameters are consistent with   abnormal left ventricular relaxation (grade 1 diastolic   dysfunction). The E/e&' ratio is between 8-15, suggesting   indeterminate LV filling pressure. - Mitral valve: Mildly thickened leaflets . There was trivial   regurgitation. - Left atrium: The atrium was normal in size. - Tricuspid valve: There was trivial regurgitation. - Pulmonary arteries: PA peak pressure: 28 mm Hg (S). - Inferior vena cava: The vessel was normal in size. The   respirophasic diameter changes were in the normal range (= 50%),   consistent with normal central venous pressure.  EKG:  EKG is ordered today.  The ekg ordered today demonstrates  sinus rhythm, borderline low voltage, QTC 418 ms, no repolarization abnormalities  Recent Labs: 02/03/2018: BUN 13; Creatinine, Ser 1.11; Magnesium 1.9; NT-Pro BNP 169; Potassium 3.9; Sodium 139  Recent Lipid Panel No results found for: CHOL, TRIG, HDL, CHOLHDL, VLDL, LDLCALC, LDLDIRECT  Physical Exam:    VS:  BP 122/70   Pulse 68   Ht 5\' 4"  (1.626 m)   Wt 210 lb 6.4 oz (95.4 kg)   BMI 36.12 kg/m     Wt Readings from Last 3 Encounters:  06/23/18 210 lb 6.4 oz (95.4 kg)  02/03/18 216 lb (98 kg)  01/02/18 222 lb (100.7 kg)  General: Alert, oriented x3, no distress, severe obesity limits the physical exam Head: no evidence of trauma, PERRL, EOMI, no exophtalmos or lid lag, no myxedema, no xanthelasma; normal ears, nose and oropharynx Neck: Difficult to see, but suspect roughly 6-8 cm elevation in jugular venous pulsations and prompt hepatojugular reflux; brisk carotid pulses without delay and no carotid bruits Chest: clear to auscultation, no signs of consolidation by percussion or palpation, normal fremitus, symmetrical and full respiratory excursions Cardiovascular: normal position and quality of the apical impulse, regular rhythm, normal first and second heart sounds, no murmurs, rubs or gallops Abdomen: no tenderness or distention, no masses by palpation, no abnormal pulsatility or arterial bruits, normal bowel sounds, no hepatosplenomegaly Extremities: no clubbing, cyanosis, symmetrical 1-2+ ankle swelling, mildly pitting edema; 2+ radial, ulnar and brachial pulses bilaterally; 2+ right femoral, posterior tibial and dorsalis pedis pulses; 2+ left femoral, posterior tibial and dorsalis pedis pulses; no subclavian or femoral bruits Neurological: grossly nonfocal Psych: Normal mood and affect   ASSESSMENT:    1. Chronic diastolic heart failure ([place])   2. Essential hypertension   3. Hypercholesterolemia   4. Diabetes mellitus type 2 in obese ([facility])   5. Class 2 severe obesity  due to excess calories with serious comorbidity and body mass index (BMI) of 36.0 to 36.9 in adult ([place])   6. Medication management    PLAN:    In order of problems listed above:  1. CHF: She still seems to be a little bit hypervolemic although I am not sure whether she is describing PND or not.  Her weight is down 10 pounds since we started diuretics.  Her proBNP was actually normal in June, but I wonder how representative this is in view of her obesity.  Echo Doppler parameters were in the indeterminate range.  We will increase the diuretic dose of little bit more.  Recheck labs after that. 2. HTN: Excellent control. 3. HLP: On statin, labs followed by Dr. Ashby Dawes, target LDL under 100 due to diabetes.  No known significant coronary or peripheral vascular disease 4. DM: On metformin monotherapy 5. Obesity: Moderate to severe with numerous comorbidities.  Difficult to say how much of her weight change since May has been due to diuretics or true weight loss   Medication Adjustments/Labs and Tests Ordered: Current medicines are reviewed at length with the patient today.  Concerns regarding medicines are outlined above.  Orders Placed This Encounter  Procedures  . Basic metabolic panel  . Magnesium  . EKG 12-Lead   Meds ordered this encounter  Medications  . furosemide (LASIX) 80 MG tablet    Sig: Take 1.5 tablets (120 mg total) by mouth daily.    Dispense:  135 tablet    Refill:  3  . spironolactone (ALDACTONE) 25 MG tablet    Sig: Take 1.5 tablets (37.5 mg total) by mouth daily.    Dispense:  135 tablet    Refill:  3    Patient Instructions  Medication Instructions:  Dr Sallyanne Kuster has recommended making the following medication changes: 1. INCREASE Furosemide to 120 mg (1.5 tablets) daily 2. INCREASE Spironolactone to 37.5 mg (1.5 tablets) daily  If you need a refill on your cardiac medications before your next appointment, please call your pharmacy.   Lab work: Your  physician recommends that you return for lab work on December 4th, 2019.  If you have labs (blood work) drawn today and your tests are completely normal, you will receive your results only by: Marland Kitchen  MyChart Message (if you have MyChart) OR . A paper copy in the mail If you have any lab test that is abnormal or we need to change your treatment, we will call you to review the results.  Follow-Up: At [facility], you and your health needs are our priority.  As part of our continuing mission to provide you with exceptional heart care, we have created designated Provider Care Teams.  These Care Teams include your primary Cardiologist (physician) and Advanced Practice Providers (APPs -  Physician Assistants and Nurse Practitioners) who all work together to provide you with the care you need, when you need it. You will need a follow up appointment in 3 months. You may see Sanda Klein, MD or one of the following Advanced Practice Providers on your designated Care Team: [place], [place] . Fabian Sharp, PA-C    Signed, Sanda Klein, MD  06/24/2018 9:29 AM    [place]

## 2018-06-23 NOTE — Patient Instructions (Signed)
Medication Instructions:  Dr Sallyanne Kuster has recommended making the following medication changes: 1. INCREASE Furosemide to 120 mg (1.5 tablets) daily 2. INCREASE Spironolactone to 37.5 mg (1.5 tablets) daily  If you need a refill on your cardiac medications before your next appointment, please call your pharmacy.   Lab work: Your physician recommends that you return for lab work on December 4th, 2019.  If you have labs (blood work) drawn today and your tests are completely normal, you will receive your results only by: Marland Kitchen MyChart Message (if you have MyChart) OR . A paper copy in the mail If you have any lab test that is abnormal or we need to change your treatment, we will call you to review the results.  Follow-Up: At [facility], you and your health needs are our priority.  As part of our continuing mission to provide you with exceptional heart care, we have created designated Provider Care Teams.  These Care Teams include your primary Cardiologist (physician) and Advanced Practice Providers (APPs -  Physician Assistants and Nurse Practitioners) who all work together to provide you with the care you need, when you need it. You will need a follow up appointment in 3 months. You may see Sanda Klein, MD or one of the following Advanced Practice Providers on your designated Care Team: [place], [place] . Fabian Sharp, PA-C

## 2018-06-24 DIAGNOSIS — I5032 Chronic diastolic (congestive) heart failure: Secondary | ICD-10-CM | POA: Insufficient documentation

## 2018-07-16 DIAGNOSIS — I10 Essential (primary) hypertension: Secondary | ICD-10-CM | POA: Diagnosis not present

## 2018-07-16 DIAGNOSIS — Z79899 Other long term (current) drug therapy: Secondary | ICD-10-CM | POA: Diagnosis not present

## 2018-07-16 LAB — BASIC METABOLIC PANEL
BUN/Creatinine Ratio: 17 (ref 12–28)
BUN: 19 mg/dL (ref 8–27)
CO2: 25 mmol/L (ref 20–29)
Calcium: 9.7 mg/dL (ref 8.7–10.3)
Chloride: 97 mmol/L (ref 96–106)
Creatinine, Ser: 1.14 mg/dL — ABNORMAL HIGH (ref 0.57–1.00)
GFR calc Af Amer: 57 mL/min/{1.73_m2} — ABNORMAL LOW (ref >59)
GFR calc non Af Amer: 50 mL/min/{1.73_m2} — ABNORMAL LOW (ref >59)
Glucose: 122 mg/dL — ABNORMAL HIGH (ref 65–99)
Potassium: 3.5 mmol/L (ref 3.5–5.2)
Sodium: 139 mmol/L (ref 134–144)

## 2018-07-16 LAB — MAGNESIUM: MAGNESIUM: 1.7 mg/dL (ref 1.6–2.3)

## 2018-09-26 ENCOUNTER — Ambulatory Visit: Payer: Medicare Other | Admitting: Cardiovascular Disease

## 2018-09-30 ENCOUNTER — Ambulatory Visit: Payer: Medicare Other | Admitting: Physician Assistant

## 2018-10-16 ENCOUNTER — Ambulatory Visit: Payer: Medicare Other | Admitting: Physician Assistant

## 2018-10-22 DIAGNOSIS — I2510 Atherosclerotic heart disease of native coronary artery without angina pectoris: Secondary | ICD-10-CM | POA: Diagnosis not present

## 2018-10-22 DIAGNOSIS — E118 Type 2 diabetes mellitus with unspecified complications: Secondary | ICD-10-CM | POA: Diagnosis not present

## 2018-10-22 DIAGNOSIS — E1121 Type 2 diabetes mellitus with diabetic nephropathy: Secondary | ICD-10-CM | POA: Diagnosis not present

## 2018-10-22 DIAGNOSIS — I129 Hypertensive chronic kidney disease with stage 1 through stage 4 chronic kidney disease, or unspecified chronic kidney disease: Secondary | ICD-10-CM | POA: Diagnosis not present

## 2018-11-03 DIAGNOSIS — E1121 Type 2 diabetes mellitus with diabetic nephropathy: Secondary | ICD-10-CM | POA: Diagnosis not present

## 2018-11-03 DIAGNOSIS — E118 Type 2 diabetes mellitus with unspecified complications: Secondary | ICD-10-CM | POA: Diagnosis not present

## 2018-11-03 DIAGNOSIS — I2510 Atherosclerotic heart disease of native coronary artery without angina pectoris: Secondary | ICD-10-CM | POA: Diagnosis not present

## 2018-11-03 DIAGNOSIS — E782 Mixed hyperlipidemia: Secondary | ICD-10-CM | POA: Diagnosis not present

## 2018-11-03 DIAGNOSIS — M25561 Pain in right knee: Secondary | ICD-10-CM | POA: Diagnosis not present

## 2018-11-14 ENCOUNTER — Telehealth: Payer: Self-pay

## 2018-11-14 NOTE — Telephone Encounter (Signed)
Left detailed message for the patient about her 4/6 appointment that will be by telephone to have her BP and HR if he has a machine, a scale to weigh herselg and to have paper and pen on hand.

## 2018-11-17 ENCOUNTER — Telehealth (INDEPENDENT_AMBULATORY_CARE_PROVIDER_SITE_OTHER): Payer: Medicare Other | Admitting: Physician Assistant

## 2018-11-17 ENCOUNTER — Other Ambulatory Visit: Payer: Self-pay

## 2018-11-17 DIAGNOSIS — I10 Essential (primary) hypertension: Secondary | ICD-10-CM

## 2018-11-17 DIAGNOSIS — I2510 Atherosclerotic heart disease of native coronary artery without angina pectoris: Secondary | ICD-10-CM

## 2018-11-17 DIAGNOSIS — I5032 Chronic diastolic (congestive) heart failure: Secondary | ICD-10-CM

## 2018-11-17 DIAGNOSIS — E785 Hyperlipidemia, unspecified: Secondary | ICD-10-CM

## 2018-11-17 DIAGNOSIS — E119 Type 2 diabetes mellitus without complications: Secondary | ICD-10-CM

## 2018-11-17 NOTE — Patient Instructions (Signed)
Medication Instructions:   Your physician recommends that you continue on your current medications as directed. Please refer to the Current Medication list given to you today.  If you need a refill on your cardiac medications before your next appointment, please call your pharmacy.   Lab work:  NONE ordered at this time of the appointment   If you have labs (blood work) drawn today and your tests are completely normal, you will receive your results only by: Marland Kitchen MyChart Message (if you have MyChart) OR . A paper copy in the mail If you have any lab test that is abnormal or we need to change your treatment, we will call you to review the results.  Testing/Procedures: NONE ordered at this time of the appointment   Follow-Up: At [facility], you and your health needs are our priority.  As part of our continuing mission to provide you with exceptional heart care, we have created designated Provider Care Teams.  These Care Teams include your primary Cardiologist (physician) and Advanced Practice Providers (APPs -  Physician Assistants and Nurse Practitioners) who all work together to provide you with the care you need, when you need it. You will need a follow up appointment in 4-5 months.  Please call our office 2 months in advance to schedule this appointment.  You may see Sanda Klein, MD or one of the following Advanced Practice Providers on your designated Care Team: [place], [place] . Fabian Sharp, PA-C  Any Other Special Instructions Will Be Listed Below (If Applicable).

## 2018-11-17 NOTE — Progress Notes (Signed)
Virtual Visit via Telephone Note   This visit type was conducted due to national recommendations for restrictions regarding the COVID-19 Pandemic (e.g. social distancing) in an effort to limit this patient's exposure and mitigate transmission in our community.  Due to her co-morbid illnesses, this patient is at least at moderate risk for complications without adequate follow up.  This format is felt to be most appropriate for this patient at this time.  The patient did not have access to video technology/had technical difficulties with video requiring transitioning to audio format only (telephone).  All issues noted in this document were discussed and addressed.  No physical exam could be performed with this format.  Please refer to the patient's chart for her  consent to telehealth for [facility].   Evaluation Performed:  Follow-up visit  Date:  11/17/2018   ID:  Crystal Holt, Crystal Holt, MRN [id number]  Patient Location: Home  Provider Location: Home  PCP:  Merrilee Seashore, MD  Cardiologist:  Sanda Klein, MD  Electrophysiologist:  None   Chief Complaint:  followup  History of Present Illness:    Crystal Holt is a 68 y.o. female who presents via audio/video conferencing for a telehealth visit today.    Crystal Holt is a 68 year old female with past medical history of chronic diastolic heart failure, hypertension, hyperlipidemia, severe obesity and DM2.  She had a cardiac catheterization in [place] in September 2017 that showed 40% proximal LAD, minor irregularities in the left circumflex, clean dominant RCA and left main.  Her previous cardiologist prior to moving to [place] with Dr. Lala Holt at [facility].  In 2008, she had colon cancer and underwent partial colectomy, chemotherapy and radiation therapy.  Family history of CAD with her mother undergoing 2 open heart surgeries.  Her grandmother also died from heart problem as well.  Last  echocardiogram obtained on 01/17/2018 showed EF 60 to 65%, grade 1 DD, mild LVH.  She was last seen by Dr. Sallyanne Kuster on 06/23/2018, she described symptomatic paroxysmal nocturnal dyspnea and lower extremity edema, her Lasix was increased to 120 mg daily and the spironolactone increased to 37.5 mg daily.  Repeat lab work in December was stable.  Talking with the patient, she has been quite stable in the past several months.  She had blood work done at her PCPs office about 2 weeks ago.  Overall, she still occasionally wake up in the middle of the night with shortness of breath, however this is largely improved and occurred very recently.  She does not weigh herself at home, she says her scale ran out of battery.  I did encourage her to check her weight at least once a week to follow-up on the weight changes.  We were unable to obtain any vital signs prior to today's visit.  Overall, she denies any chest discomfort, she continue to ambulate at home using a walker.  She is stable from cardiology perspective.  The patient does not have symptoms concerning for COVID-19 infection (fever, chills, cough, or new shortness of breath).    Past Medical History:  Diagnosis Date  . Colon cancer ([place])   . Diabetes mellitus without complication ([place])   . Heart palpitations   . Hyperlipidemia   . Hypertension    Past Surgical History:  Procedure Laterality Date  . ABDOMINAL HERNIA REPAIR  2009  . ABDOMINAL HYSTERECTOMY    . CARDIAC CATHETERIZATION  05/09/2016   By Phillips Climes in  [address], LAD 40%, CFX diffuse irregularities, LVEDP 14, medical therapy  . COLON SURGERY  2009     Current Meds  Medication Sig  . aspirin 81 MG tablet Take 81 mg by mouth daily.  . furosemide (LASIX) 80 MG tablet Take 1.5 tablets (120 mg total) by mouth daily.  . metFORMIN (GLUCOPHAGE-XR) 500 MG 24 hr tablet Take 1,000 mg by mouth daily with breakfast. Take 2 capsules in the evenings  . metoprolol tartrate  (LOPRESSOR) 100 MG tablet Take 100 mg by mouth 2 (two) times daily.  Marland Kitchen omeprazole (PRILOSEC) 40 MG capsule Take 40 mg by mouth daily.  . pravastatin (PRAVACHOL) 40 MG tablet Take 40 mg by mouth daily.  Marland Kitchen spironolactone (ALDACTONE) 25 MG tablet Take 1.5 tablets (37.5 mg total) by mouth daily.     Allergies:   Aspirin and Penicillins   Social History   Tobacco Use  . Smoking status: Never Smoker  . Smokeless tobacco: Never Used  Substance Use Topics  . Alcohol use: No  . Drug use: No     Family Hx: The patient's family history includes Asthma in an other family member; Cancer in an other family member; Diabetes in an other family member; Heart disease in an other family member; Hyperlipidemia in an other family member; Hypertension in an other family member.  ROS:   Please see the history of present illness.     All other systems reviewed and are negative.   Prior CV studies:   The following studies were reviewed today:  Echo 01/17/2018 LV EF: 60% -   65%  ------------------------------------------------------------------- Indications:      (R06.02). GLS 18.1  ------------------------------------------------------------------- History:   PMH:   Chest pain.  Dyspnea and bilateral lower extremity edema. The patient has a GI malignancy and is status post chemotherapy and radiotherapy.  Risk factors:  Hypertension. Diabetes mellitus. Morbidly obese. Dyslipidemia.  ------------------------------------------------------------------- Study Conclusions  - Left ventricle: The cavity size was normal. Wall thickness was   increased in a pattern of mild LVH. Systolic function was normal.   The estimated ejection fraction was in the range of 60% to 65%.   GLS -18.1%. Wall motion was normal; there were no regional wall   motion abnormalities. Doppler parameters are consistent with   abnormal left ventricular relaxation (grade 1 diastolic   dysfunction). The E/e&' ratio is between  8-15, suggesting   indeterminate LV filling pressure. - Mitral valve: Mildly thickened leaflets . There was trivial   regurgitation. - Left atrium: The atrium was normal in size. - Tricuspid valve: There was trivial regurgitation. - Pulmonary arteries: PA peak pressure: 28 mm Hg (S). - Inferior vena cava: The vessel was normal in size. The   respirophasic diameter changes were in the normal range (= 50%),   consistent with normal central venous pressure.  Impressions:  - LVEF 60-65%, mild LVH, normal wall motion, grade 1 DD,   indeterminate LV filling pressure, trivial MR, normal LA size,   trivial TR, RVSP 28 mmHg, normal IVC.  Labs/Other Tests and Data Reviewed:    EKG:  An ECG dated 06/23/2018 was personally reviewed today and demonstrated:  Normal sinus rhythm, no significant ST-T wave changes.  Recent Labs: 02/03/2018: NT-Pro BNP 169 07/16/2018: BUN 19; Creatinine, Ser 1.14; Magnesium 1.7; Potassium 3.5; Sodium 139   Recent Lipid Panel No results found for: CHOL, TRIG, HDL, CHOLHDL, LDLCALC, LDLDIRECT  Wt Readings from Last 3 Encounters:  06/23/18 210 lb 6.4 oz (95.4 kg)  02/03/18 216 lb (98 kg)  01/02/18 222 lb (100.7 kg)     Objective:    Vital Signs:  There were no vitals taken for this visit.   Well nourished, well developed female in no acute distress.   ASSESSMENT & PLAN:    1. Chronic diastolic heart failure: Her diuretic was increased in November, repeat lab work was stable.  She says she got additional lab work 2 weeks ago, I am unable to see the result.  Overall she seems to be doing quite well.  2. CAD: Previous cardiac catheterization in September 2017 showed mild disease.  She denies any recent chest pain.  Continue 81 mg aspirin and statin.  3. Hypertension: Unable to check her blood pressure prior to today's visit.  She has been instructed to monitor her blood pressure at home.  4. Hyperlipidemia: On Pravachol 40 mg daily.  5. DM2: Followed by  primary care provider  COVID-19 Education: The signs and symptoms of COVID-19 were discussed with the patient and how to seek care for testing (follow up with PCP or arrange E-visit).  The importance of social distancing was discussed today.  Time:   Today, I have spent 8 minutes with the patient with telehealth technology discussing the above problems.     Medication Adjustments/Labs and Tests Ordered: Current medicines are reviewed at length with the patient today.  Concerns regarding medicines are outlined above.  Tests Ordered: No orders of the defined types were placed in this encounter.  Medication Changes: No orders of the defined types were placed in this encounter.   Disposition:  Follow up in 4 month(s)  Signed, Almyra Deforest, [place]  11/17/2018 3:13 PM    [facility]

## 2018-11-18 DIAGNOSIS — M25561 Pain in right knee: Secondary | ICD-10-CM | POA: Diagnosis not present

## 2018-11-18 DIAGNOSIS — I129 Hypertensive chronic kidney disease with stage 1 through stage 4 chronic kidney disease, or unspecified chronic kidney disease: Secondary | ICD-10-CM | POA: Diagnosis not present

## 2018-11-18 DIAGNOSIS — E118 Type 2 diabetes mellitus with unspecified complications: Secondary | ICD-10-CM | POA: Diagnosis not present

## 2018-11-18 DIAGNOSIS — E1121 Type 2 diabetes mellitus with diabetic nephropathy: Secondary | ICD-10-CM | POA: Diagnosis not present

## 2018-11-18 DIAGNOSIS — I2510 Atherosclerotic heart disease of native coronary artery without angina pectoris: Secondary | ICD-10-CM | POA: Diagnosis not present

## 2019-03-24 DIAGNOSIS — E118 Type 2 diabetes mellitus with unspecified complications: Secondary | ICD-10-CM | POA: Diagnosis not present

## 2019-03-24 DIAGNOSIS — E1121 Type 2 diabetes mellitus with diabetic nephropathy: Secondary | ICD-10-CM | POA: Diagnosis not present

## 2019-03-24 DIAGNOSIS — I2510 Atherosclerotic heart disease of native coronary artery without angina pectoris: Secondary | ICD-10-CM | POA: Diagnosis not present

## 2019-03-24 DIAGNOSIS — I129 Hypertensive chronic kidney disease with stage 1 through stage 4 chronic kidney disease, or unspecified chronic kidney disease: Secondary | ICD-10-CM | POA: Diagnosis not present

## 2019-03-30 DIAGNOSIS — E118 Type 2 diabetes mellitus with unspecified complications: Secondary | ICD-10-CM | POA: Diagnosis not present

## 2019-03-30 DIAGNOSIS — N183 Chronic kidney disease, stage 3 (moderate): Secondary | ICD-10-CM | POA: Diagnosis not present

## 2019-03-30 DIAGNOSIS — Z7189 Other specified counseling: Secondary | ICD-10-CM | POA: Diagnosis not present

## 2019-03-30 DIAGNOSIS — I2510 Atherosclerotic heart disease of native coronary artery without angina pectoris: Secondary | ICD-10-CM | POA: Diagnosis not present

## 2019-03-30 DIAGNOSIS — R252 Cramp and spasm: Secondary | ICD-10-CM | POA: Diagnosis not present

## 2019-04-13 DIAGNOSIS — I129 Hypertensive chronic kidney disease with stage 1 through stage 4 chronic kidney disease, or unspecified chronic kidney disease: Secondary | ICD-10-CM | POA: Diagnosis not present

## 2019-04-13 DIAGNOSIS — N183 Chronic kidney disease, stage 3 (moderate): Secondary | ICD-10-CM | POA: Diagnosis not present

## 2019-05-20 DIAGNOSIS — I2510 Atherosclerotic heart disease of native coronary artery without angina pectoris: Secondary | ICD-10-CM | POA: Diagnosis not present

## 2019-05-20 DIAGNOSIS — E1121 Type 2 diabetes mellitus with diabetic nephropathy: Secondary | ICD-10-CM | POA: Diagnosis not present

## 2019-05-20 DIAGNOSIS — E118 Type 2 diabetes mellitus with unspecified complications: Secondary | ICD-10-CM | POA: Diagnosis not present

## 2019-05-20 DIAGNOSIS — Z23 Encounter for immunization: Secondary | ICD-10-CM | POA: Diagnosis not present

## 2019-05-20 DIAGNOSIS — I129 Hypertensive chronic kidney disease with stage 1 through stage 4 chronic kidney disease, or unspecified chronic kidney disease: Secondary | ICD-10-CM | POA: Diagnosis not present

## 2019-05-20 DIAGNOSIS — I10 Essential (primary) hypertension: Secondary | ICD-10-CM | POA: Diagnosis not present

## 2019-06-26 ENCOUNTER — Other Ambulatory Visit: Payer: Self-pay

## 2019-06-26 MED ORDER — SPIRONOLACTONE 25 MG PO TABS: 37.5000 mg | ORAL_TABLET | Freq: Every day | ORAL | 3 refills | Status: DC

## 2019-06-26 MED ORDER — FUROSEMIDE 80 MG PO TABS: 120.0000 mg | ORAL_TABLET | Freq: Every day | ORAL | 3 refills | Status: DC

## 2019-06-29 ENCOUNTER — Other Ambulatory Visit: Payer: Self-pay

## 2019-06-29 MED ORDER — FUROSEMIDE 80 MG PO TABS: 120.0000 mg | ORAL_TABLET | Freq: Every day | ORAL | 3 refills | Status: DC

## 2019-06-29 MED ORDER — SPIRONOLACTONE 25 MG PO TABS: 37.5000 mg | ORAL_TABLET | Freq: Every day | ORAL | 3 refills | Status: DC

## 2019-09-07 DIAGNOSIS — Z0000 Encounter for general adult medical examination without abnormal findings: Secondary | ICD-10-CM | POA: Diagnosis not present

## 2019-09-07 DIAGNOSIS — I2510 Atherosclerotic heart disease of native coronary artery without angina pectoris: Secondary | ICD-10-CM | POA: Diagnosis not present

## 2019-09-07 DIAGNOSIS — I129 Hypertensive chronic kidney disease with stage 1 through stage 4 chronic kidney disease, or unspecified chronic kidney disease: Secondary | ICD-10-CM | POA: Diagnosis not present

## 2019-09-07 DIAGNOSIS — E1121 Type 2 diabetes mellitus with diabetic nephropathy: Secondary | ICD-10-CM | POA: Diagnosis not present

## 2019-09-07 DIAGNOSIS — E118 Type 2 diabetes mellitus with unspecified complications: Secondary | ICD-10-CM | POA: Diagnosis not present

## 2019-09-09 DIAGNOSIS — I130 Hypertensive heart and chronic kidney disease with heart failure and stage 1 through stage 4 chronic kidney disease, or unspecified chronic kidney disease: Secondary | ICD-10-CM | POA: Diagnosis not present

## 2019-09-09 DIAGNOSIS — Z0000 Encounter for general adult medical examination without abnormal findings: Secondary | ICD-10-CM | POA: Diagnosis not present

## 2019-09-09 DIAGNOSIS — I5032 Chronic diastolic (congestive) heart failure: Secondary | ICD-10-CM | POA: Diagnosis not present

## 2019-09-09 DIAGNOSIS — E118 Type 2 diabetes mellitus with unspecified complications: Secondary | ICD-10-CM | POA: Diagnosis not present

## 2019-09-09 DIAGNOSIS — M79672 Pain in left foot: Secondary | ICD-10-CM | POA: Diagnosis not present

## 2019-10-03 ENCOUNTER — Ambulatory Visit: Payer: Medicare Other | Attending: Internal Medicine

## 2019-10-03 DIAGNOSIS — Z23 Encounter for immunization: Secondary | ICD-10-CM | POA: Insufficient documentation

## 2019-10-03 NOTE — Progress Notes (Signed)
   Covid-19 Vaccination Clinic  Name:  RHIA MEAKIN    MRN: [id number] DOB: [birth year]  10/03/2019  Ms. Graef was observed post Covid-19 immunization for 15 minutes without incidence. She was provided with Vaccine Information Sheet and instruction to access the V-Safe system.   Ms. Cadd was instructed to call 911 with any severe reactions post vaccine: Marland Kitchen Difficulty breathing  . Swelling of your face and throat  . A fast heartbeat  . A bad rash all over your body  . Dizziness and weakness    Immunizations Administered    Name Date Dose VIS Date Route   Pfizer COVID-19 Vaccine 10/03/2019  9:14 AM 0.3 mL 07/24/2019 Intramuscular   Manufacturer: [place]   Lot: [id number]   [place]: [id number]

## 2019-10-26 ENCOUNTER — Ambulatory Visit: Payer: Medicare Other | Attending: Internal Medicine

## 2019-10-26 DIAGNOSIS — Z23 Encounter for immunization: Secondary | ICD-10-CM

## 2019-10-26 NOTE — Progress Notes (Signed)
   Covid-19 Vaccination Clinic  Name:  Crystal Holt    MRN: [id number] DOB: May 01, 1951  10/26/2019  Ms. Sifford was observed post Covid-19 immunization for 30 minutes based on pre-vaccination screening without incident. She was provided with Vaccine Information Sheet and instruction to access the V-Safe system.   Ms. Wohlers was instructed to call 911 with any severe reactions post vaccine: Marland Kitchen Difficulty breathing  . Swelling of face and throat  . A fast heartbeat  . A bad rash all over body  . Dizziness and weakness   Immunizations Administered    Name Date Dose VIS Date Route   Pfizer COVID-19 Vaccine 10/26/2019  8:23 AM 0.3 mL 07/24/2019 Intramuscular   Manufacturer: [place]   Lot: [id number]   [place]: [id number]

## 2019-10-28 ENCOUNTER — Telehealth (INDEPENDENT_AMBULATORY_CARE_PROVIDER_SITE_OTHER): Payer: Medicare Other | Admitting: Physician Assistant

## 2019-10-28 ENCOUNTER — Telehealth: Payer: Self-pay

## 2019-10-28 ENCOUNTER — Encounter: Payer: Self-pay | Admitting: Physician Assistant

## 2019-10-28 VITALS — BP 138/80 | HR 67 | Ht 64.0 in | Wt 188.0 lb

## 2019-10-28 DIAGNOSIS — E785 Hyperlipidemia, unspecified: Secondary | ICD-10-CM | POA: Diagnosis not present

## 2019-10-28 DIAGNOSIS — E119 Type 2 diabetes mellitus without complications: Secondary | ICD-10-CM

## 2019-10-28 DIAGNOSIS — I5032 Chronic diastolic (congestive) heart failure: Secondary | ICD-10-CM | POA: Diagnosis not present

## 2019-10-28 DIAGNOSIS — I10 Essential (primary) hypertension: Secondary | ICD-10-CM | POA: Diagnosis not present

## 2019-10-28 DIAGNOSIS — I2510 Atherosclerotic heart disease of native coronary artery without angina pectoris: Secondary | ICD-10-CM

## 2019-10-28 NOTE — Telephone Encounter (Signed)
  Patient Consent for Virtual Visit         Crystal Holt has provided verbal consent on 10/28/2019 for a virtual visit (video or telephone).   CONSENT FOR VIRTUAL VISIT FOR:  Crystal Holt  By participating in this virtual visit I agree to the following:  I hereby voluntarily request, consent and authorize [facility] and its employed or contracted physicians, physician assistants, nurse practitioners or other licensed health care professionals (the Practitioner), to provide me with telemedicine health care services (the "Services") as deemed necessary by the treating Practitioner. I acknowledge and consent to receive the Services by the Practitioner via telemedicine. I understand that the telemedicine visit will involve communicating with the Practitioner through live audiovisual communication technology and the disclosure of certain medical information by electronic transmission. I acknowledge that I have been given the opportunity to request an in-person assessment or other available alternative prior to the telemedicine visit and am voluntarily participating in the telemedicine visit.  I understand that I have the right to withhold or withdraw my consent to the use of telemedicine in the course of my care at any time, without affecting my right to future care or treatment, and that the Practitioner or I may terminate the telemedicine visit at any time. I understand that I have the right to inspect all information obtained and/or recorded in the course of the telemedicine visit and may receive copies of available information for a reasonable fee.  I understand that some of the potential risks of receiving the Services via telemedicine include:  Marland Kitchen Delay or interruption in medical evaluation due to technological equipment failure or disruption; . Information transmitted may not be sufficient (e.g. poor resolution of images) to allow for appropriate medical decision making by the Practitioner;  and/or  . In rare instances, security protocols could fail, causing a breach of personal health information.  Furthermore, I acknowledge that it is my responsibility to provide information about my medical history, conditions and care that is complete and accurate to the best of my ability. I acknowledge that Practitioner's advice, recommendations, and/or decision may be based on factors not within their control, such as incomplete or inaccurate data provided by me or distortions of diagnostic images or specimens that may result from electronic transmissions. I understand that the practice of medicine is not an exact science and that Practitioner makes no warranties or guarantees regarding treatment outcomes. I acknowledge that a copy of this consent can be made available to me via my patient portal ([facility]), or I can request a printed copy by calling the office of [facility].    I understand that my insurance will be billed for this visit.   I have read or had this consent read to me. . I understand the contents of this consent, which adequately explains the benefits and risks of the Services being provided via telemedicine.  . I have been provided ample opportunity to ask questions regarding this consent and the Services and have had my questions answered to my satisfaction. . I give my informed consent for the services to be provided through the use of telemedicine in my medical care

## 2019-10-28 NOTE — Progress Notes (Signed)
Virtual Visit via Telephone Note   This visit type was conducted due to national recommendations for restrictions regarding the COVID-19 Pandemic (e.g. social distancing) in an effort to limit this patient's exposure and mitigate transmission in our community.  Due to her co-morbid illnesses, this patient is at least at moderate risk for complications without adequate follow up.  This format is felt to be most appropriate for this patient at this time.  The patient did not have access to video technology/had technical difficulties with video requiring transitioning to audio format only (telephone).  All issues noted in this document were discussed and addressed.  No physical exam could be performed with this format.  Please refer to the patient's chart for her  consent to telehealth for [facility]   The patient was identified using 2 identifiers.  Date:  10/28/2019   ID:  Crystal Holt, DOB Jul 31, 1951, MRN [id number]  Patient Location: Home Provider Location: Home  PCP:  Merrilee Seashore, MD  Cardiologist:  Sanda Klein, MD  Electrophysiologist:  None   Evaluation Performed:  Follow-Up Visit  Chief Complaint:  Annual visit  History of Present Illness:    Crystal Holt is a 69 y.o. female with past medical history of chronic diastolic heart failure, CAD, HTN, HLD, DM II and severe obesity. She had a cardiac catheterization in [place] in September 2017 that showed 40% proximal LAD, minor irregularities in the left circumflex, clean dominant RCA and left main.  Her previous cardiologist prior to moving to [place] was Dr. Lala Lund at [facility].  In 2008, she had colon cancer and underwent partial colectomy, chemotherapy and radiation therapy.  She has family history of CAD with her mother undergoing 2 open heart surgeries.  Her grandmother also died from heart problem as well.  Last echocardiogram obtained on 01/17/2018 showed EF 60 to 65%, grade  1 DD, mild LVH.  She was last seen by Dr. Sallyanne Kuster on 06/23/2018, she described symptomatic paroxysmal nocturnal dyspnea and lower extremity edema, her Lasix was increased to 120 mg daily and spironolactone increased to 37.5 mg daily.  Repeat lab work was stable.  I last saw the patient via virtual visit in April 2020 at which time she was doing well.  Patient is doing well from cardiology perspective.  She has occasional twinge in the chest however she associated this with gas as it typically occurs after she eats.  She does not have any exertional symptoms.  She has no lower extremity edema, orthopnea or PND.  Both her and her husband had the first [place] shot on 09/23/2019 and they just received at their second [place] vaccine shot on 3/15.  Neither one of them had any symptom associated with COVID-19 vaccination.  Overall she is doing well from cardiology perspective and can follow-up in 1 year.  The patient does not have symptoms concerning for COVID-19 infection (fever, chills, cough, or new shortness of breath).    Past Medical History:  Diagnosis Date  . Colon cancer ([place])   . Diabetes mellitus without complication ([place])   . Heart palpitations   . Hyperlipidemia   . Hypertension    Past Surgical History:  Procedure Laterality Date  . ABDOMINAL HERNIA REPAIR  2009  . ABDOMINAL HYSTERECTOMY    . CARDIAC CATHETERIZATION  05/09/2016   By Phillips Climes in [place], LAD 40%, CFX diffuse irregularities, LVEDP 14, medical therapy  . COLON SURGERY  2009  Current Meds  Medication Sig  . albuterol (PROVENTIL HFA;VENTOLIN HFA) 108 (90 Base) MCG/ACT inhaler Inhale 2 puffs into the lungs every 6 (six) hours as needed for wheezing or shortness of breath.  Marland Kitchen aspirin 81 MG tablet Take 81 mg by mouth daily.  . celecoxib (CELEBREX) 200 MG capsule Take 200 mg by mouth as needed.   . furosemide (LASIX) 80 MG tablet Take 1.5 tablets (120 mg total) by mouth daily.  . Magnesium 400 MG TABS  Take 400 mg by mouth daily.  . metFORMIN (GLUCOPHAGE-XR) 500 MG 24 hr tablet Take 1,000 mg by mouth daily with breakfast. Take 2 capsules in the evenings  . metoprolol tartrate (LOPRESSOR) 100 MG tablet Take 100 mg by mouth 2 (two) times daily.  . Multiple Vitamin (MULTIVITAMIN WITH MINERALS) TABS tablet Take 1 tablet by mouth daily.  Marland Kitchen omeprazole (PRILOSEC) 40 MG capsule Take 40 mg by mouth daily.  . pravastatin (PRAVACHOL) 40 MG tablet Take 40 mg by mouth daily.  Marland Kitchen spironolactone (ALDACTONE) 25 MG tablet Take 1.5 tablets (37.5 mg total) by mouth daily.  Marland Kitchen tiZANidine (ZANAFLEX) 4 MG tablet Take 4 mg by mouth 3 (three) times daily as needed.     Allergies:   Aspirin and Penicillins   Social History   Tobacco Use  . Smoking status: Never Smoker  . Smokeless tobacco: Never Used  Substance Use Topics  . Alcohol use: No  . Drug use: No     Family Hx: The patient's family history includes Asthma in an other family member; Cancer in an other family member; Diabetes in an other family member; Heart disease in an other family member; Hyperlipidemia in an other family member; Hypertension in an other family member.  ROS:   Please see the history of present illness.     All other systems reviewed and are negative.   Prior CV studies:   The following studies were reviewed today:   Echo 01/17/2018 LV EF: 60% - 65%  ------------------------------------------------------------------- Indications: (R06.02). GLS 18.1  ------------------------------------------------------------------- History: PMH: Chest pain. Dyspnea and bilateral lower extremity edema. The patient has a GI malignancy and is status post chemotherapy and radiotherapy. Risk factors: Hypertension. Diabetes mellitus. Morbidly obese. Dyslipidemia.  ------------------------------------------------------------------- Study Conclusions  - Left ventricle: The cavity size was normal. Wall thickness  was increased in a pattern of mild LVH. Systolic function was normal. The estimated ejection fraction was in the range of 60% to 65%. GLS -18.1%. Wall motion was normal; there were no regional wall motion abnormalities. Doppler parameters are consistent with abnormal left ventricular relaxation (grade 1 diastolic dysfunction). The E/e&' ratio is between 8-15, suggesting indeterminate LV filling pressure. - Mitral valve: Mildly thickened leaflets . There was trivial regurgitation. - Left atrium: The atrium was normal in size. - Tricuspid valve: There was trivial regurgitation. - Pulmonary arteries: PA peak pressure: 28 mm Hg (S). - Inferior vena cava: The vessel was normal in size. The respirophasic diameter changes were in the normal range (= 50%), consistent with normal central venous pressure.  Impressions:  - LVEF 60-65%, mild LVH, normal wall motion, grade 1 DD, indeterminate LV filling pressure, trivial MR, normal LA size, trivial TR, RVSP 28 mmHg, normal IVC.  Labs/Other Tests and Data Reviewed:    EKG:  An ECG dated 06/23/2018 was personally reviewed today and demonstrated:  NSR without significant ST-T wave changes  Recent Labs: No results found for requested labs within last 8760 hours.   Recent Lipid Panel No results found  for: CHOL, TRIG, HDL, CHOLHDL, LDLCALC, LDLDIRECT  Wt Readings from Last 3 Encounters:  10/28/19 188 lb (85.3 kg)  06/23/18 210 lb 6.4 oz (95.4 kg)  02/03/18 216 lb (98 kg)     Objective:    Vital Signs:  BP 138/80   Pulse 67   Ht 5\' 4"  (1.626 m)   Wt 188 lb (85.3 kg)   BMI 32.27 kg/m    VITAL SIGNS:  reviewed  ASSESSMENT & PLAN:    1. Chronic diastolic heart failure: Continue on the current dose of Lasix and spironolactone.  She denies any lower extremity edema, orthopnea or PND.  Recent lab work showed stable renal function and electrolyte.  2. CAD: Previous cardiac catheterization in September 2017 showed  mild disease.  She denies any exertional chest discomfort.  She does have occasional twinge in the chest that occurs after eating, however this does not occur with physical activity.  No further work-up at this time.  Continue aspirin and statin  3. Hypertension: Blood pressure stable on current therapy.  4. Hyperlipidemia: Continue Pravachol 40 mg daily.  Recent lab work showed very well-controlled total cholesterol and HDL, I am unable to see the LDL.  Triglyceride was 200.  Unfortunately she is unable to tolerate fish oil.  Continue diet exercise  5. DM2: Followed by primary care provider, recent hemoglobin A1c 6.9 on 09/07/2019.   COVID-19 Education: The signs and symptoms of COVID-19 were discussed with the patient and how to seek care for testing (follow up with PCP or arrange E-visit).  The importance of social distancing was discussed today.  Time:   Today, I have spent 9 minutes with the patient with telehealth technology discussing the above problems.     Medication Adjustments/Labs and Tests Ordered: Current medicines are reviewed at length with the patient today.  Concerns regarding medicines are outlined above.   Tests Ordered: No orders of the defined types were placed in this encounter.   Medication Changes: No orders of the defined types were placed in this encounter.   Follow Up:  Either In Person or Virtual in 1 year(s)  Signed, Almyra Deforest, [place]  10/28/2019 10:27 AM    [facility]

## 2019-10-28 NOTE — Patient Instructions (Signed)
Medication Instructions:  Your physician recommends that you continue on your current medications as directed. Please refer to the Current Medication list given to you today.  *If you need a refill on your cardiac medications before your next appointment, please call your pharmacy*  Lab Work: NONE ordered at this time of appointment   If you have labs (blood work) drawn today and your tests are completely normal, you will receive your results only by: Marland Kitchen MyChart Message (if you have MyChart) OR . A paper copy in the mail If you have any lab test that is abnormal or we need to change your treatment, we will call you to review the results.  Testing/Procedures: NONE ordered at this time of appointment   Follow-Up: At [facility], you and your health needs are our priority.  As part of our continuing mission to provide you with exceptional heart care, we have created designated Provider Care Teams.  These Care Teams include your primary Cardiologist (physician) and Advanced Practice Providers (APPs -  Physician Assistants and Nurse Practitioners) who all work together to provide you with the care you need, when you need it.  We recommend signing up for the patient portal called "MyChart".  Sign up information is provided on this After Visit Summary.  MyChart is used to connect with patients for Virtual Visits (Telemedicine).  Patients are able to view lab/test results, encounter notes, upcoming appointments, etc.  Non-urgent messages can be sent to your provider as well.   To learn more about what you can do with MyChart, go to [email or web address].    Your next appointment:   1 year(s)  The format for your next appointment:   In Person  Provider:   You may see Sanda Klein, MD or one of the following Advanced Practice Providers on your designated Care Team:    Almyra Deforest, PA-C  Fabian Sharp, [place] or   Roby Lofts, [place]  Other Instructions

## 2019-10-28 NOTE — Telephone Encounter (Signed)
Called patient to discuss AVS instructions gave Hao Meng's recommendations and patient voiced understanding. AVS summary mailed to patient.    

## 2019-12-09 DIAGNOSIS — E1121 Type 2 diabetes mellitus with diabetic nephropathy: Secondary | ICD-10-CM | POA: Diagnosis not present

## 2019-12-09 DIAGNOSIS — I2510 Atherosclerotic heart disease of native coronary artery without angina pectoris: Secondary | ICD-10-CM | POA: Diagnosis not present

## 2019-12-09 DIAGNOSIS — E782 Mixed hyperlipidemia: Secondary | ICD-10-CM | POA: Diagnosis not present

## 2019-12-09 DIAGNOSIS — E118 Type 2 diabetes mellitus with unspecified complications: Secondary | ICD-10-CM | POA: Diagnosis not present

## 2019-12-16 DIAGNOSIS — R072 Precordial pain: Secondary | ICD-10-CM | POA: Diagnosis not present

## 2019-12-16 DIAGNOSIS — I5032 Chronic diastolic (congestive) heart failure: Secondary | ICD-10-CM | POA: Diagnosis not present

## 2019-12-16 DIAGNOSIS — I130 Hypertensive heart and chronic kidney disease with heart failure and stage 1 through stage 4 chronic kidney disease, or unspecified chronic kidney disease: Secondary | ICD-10-CM | POA: Diagnosis not present

## 2019-12-16 DIAGNOSIS — E1121 Type 2 diabetes mellitus with diabetic nephropathy: Secondary | ICD-10-CM | POA: Diagnosis not present

## 2020-03-24 DIAGNOSIS — H8110 Benign paroxysmal vertigo, unspecified ear: Secondary | ICD-10-CM | POA: Diagnosis not present

## 2020-05-17 ENCOUNTER — Ambulatory Visit: Payer: Medicare Other | Attending: Internal Medicine

## 2020-05-17 DIAGNOSIS — Z23 Encounter for immunization: Secondary | ICD-10-CM

## 2020-05-17 NOTE — Progress Notes (Signed)
   Covid-19 Vaccination Clinic  Name:  ANEIRA CAVITT    MRN: [id number] DOB: 08/21/1950  05/17/2020  Ms. Waage was observed post Covid-19 immunization for 15 minutes without incident. She was provided with Vaccine Information Sheet and instruction to access the V-Safe system.   Ms. Delatorre was instructed to call 911 with any severe reactions post vaccine: Marland Kitchen Difficulty breathing  . Swelling of face and throat  . A fast heartbeat  . A bad rash all over body  . Dizziness and weakness

## 2020-06-08 DIAGNOSIS — I2510 Atherosclerotic heart disease of native coronary artery without angina pectoris: Secondary | ICD-10-CM | POA: Diagnosis not present

## 2020-06-08 DIAGNOSIS — E1121 Type 2 diabetes mellitus with diabetic nephropathy: Secondary | ICD-10-CM | POA: Diagnosis not present

## 2020-06-08 DIAGNOSIS — I5032 Chronic diastolic (congestive) heart failure: Secondary | ICD-10-CM | POA: Diagnosis not present

## 2020-06-08 DIAGNOSIS — I130 Hypertensive heart and chronic kidney disease with heart failure and stage 1 through stage 4 chronic kidney disease, or unspecified chronic kidney disease: Secondary | ICD-10-CM | POA: Diagnosis not present

## 2020-06-15 DIAGNOSIS — I130 Hypertensive heart and chronic kidney disease with heart failure and stage 1 through stage 4 chronic kidney disease, or unspecified chronic kidney disease: Secondary | ICD-10-CM | POA: Diagnosis not present

## 2020-06-15 DIAGNOSIS — Z23 Encounter for immunization: Secondary | ICD-10-CM | POA: Diagnosis not present

## 2020-06-15 DIAGNOSIS — E1121 Type 2 diabetes mellitus with diabetic nephropathy: Secondary | ICD-10-CM | POA: Diagnosis not present

## 2020-06-15 DIAGNOSIS — I5032 Chronic diastolic (congestive) heart failure: Secondary | ICD-10-CM | POA: Diagnosis not present

## 2020-06-15 DIAGNOSIS — M25562 Pain in left knee: Secondary | ICD-10-CM | POA: Diagnosis not present

## 2020-06-27 ENCOUNTER — Other Ambulatory Visit: Payer: Self-pay | Admitting: Cardiovascular Disease

## 2020-07-19 DIAGNOSIS — M25561 Pain in right knee: Secondary | ICD-10-CM | POA: Diagnosis not present

## 2020-07-19 DIAGNOSIS — M25562 Pain in left knee: Secondary | ICD-10-CM | POA: Diagnosis not present

## 2020-07-19 DIAGNOSIS — M1990 Unspecified osteoarthritis, unspecified site: Secondary | ICD-10-CM | POA: Diagnosis not present

## 2020-07-19 DIAGNOSIS — I10 Essential (primary) hypertension: Secondary | ICD-10-CM | POA: Diagnosis not present

## 2020-07-19 DIAGNOSIS — M25569 Pain in unspecified knee: Secondary | ICD-10-CM | POA: Diagnosis not present

## 2020-07-19 DIAGNOSIS — E119 Type 2 diabetes mellitus without complications: Secondary | ICD-10-CM | POA: Diagnosis not present

## 2020-11-22 ENCOUNTER — Ambulatory Visit: Payer: Medicare Other | Admitting: Cardiovascular Disease

## 2020-12-23 DIAGNOSIS — E1121 Type 2 diabetes mellitus with diabetic nephropathy: Secondary | ICD-10-CM | POA: Diagnosis not present

## 2020-12-23 DIAGNOSIS — I5032 Chronic diastolic (congestive) heart failure: Secondary | ICD-10-CM | POA: Diagnosis not present

## 2020-12-23 DIAGNOSIS — M25562 Pain in left knee: Secondary | ICD-10-CM | POA: Diagnosis not present

## 2021-01-05 ENCOUNTER — Other Ambulatory Visit: Payer: Self-pay

## 2021-01-05 ENCOUNTER — Encounter: Payer: Self-pay | Admitting: Cardiovascular Disease

## 2021-01-05 ENCOUNTER — Ambulatory Visit: Payer: Medicare Other | Admitting: Cardiovascular Disease

## 2021-01-05 VITALS — BP 126/74 | HR 81 | Ht 63.5 in | Wt 201.0 lb

## 2021-01-05 DIAGNOSIS — I5032 Chronic diastolic (congestive) heart failure: Secondary | ICD-10-CM | POA: Diagnosis not present

## 2021-01-05 DIAGNOSIS — Z6836 Body mass index (BMI) 36.0-36.9, adult: Secondary | ICD-10-CM

## 2021-01-05 DIAGNOSIS — E669 Obesity, unspecified: Secondary | ICD-10-CM

## 2021-01-05 DIAGNOSIS — E1169 Type 2 diabetes mellitus with other specified complication: Secondary | ICD-10-CM

## 2021-01-05 DIAGNOSIS — E7800 Pure hypercholesterolemia, unspecified: Secondary | ICD-10-CM

## 2021-01-05 DIAGNOSIS — I10 Essential (primary) hypertension: Secondary | ICD-10-CM | POA: Diagnosis not present

## 2021-01-05 DIAGNOSIS — R1319 Other dysphagia: Secondary | ICD-10-CM | POA: Diagnosis not present

## 2021-01-05 DIAGNOSIS — E119 Type 2 diabetes mellitus without complications: Secondary | ICD-10-CM

## 2021-01-05 NOTE — Progress Notes (Signed)
Cardiology Office Note:    Date:  01/05/2021   ID:  Crystal, Holt 10/11/1950, MRN [id number]  PCP:  Merrilee Seashore, MD  Cardiologist:  Sanda Klein, MD New  Referring MD: Merrilee Seashore, MD   Chief Complaint  Patient presents with  . Congestive Heart Failure   History of Present Illness:    Crystal Holt is a 70 y.o. female with a hx of diastolic heart failure, hypertension, hyperlipidemia, severe obesity and type 2 diabetes mellitus (Her husband Crystal Holt is also my patient).  She has had an uneventful year from a cardiac point of view, but is under increasing physical and emotional stress due to her husband's worsening dementia.  He keeps her up at night.  She has not had problems with exertional dyspnea, orthopnea, PND or lower extremity edema.  She denies palpitations and has not had dizziness or syncope.  She has never complained of chest discomfort.  She is no longer taking Celebrex, which appeared to cause a rash.  She did recently require a shot of cortisone for knee arthritis.  She has not required adjustment in her dose of diuretic recently.  Her weight remains steady at around 200 pounds.  Her biggest complaint is that food sometimes gets stuck in her mid chest.  She has to drink water to ease it down and it feels like a bubble that is stuck there.  This never occurs with physical activity, only during meals.  She remembers being told that she had a "ring" in her esophagus when she was living in [place], but is not entirely sure whether or not this was dilated.  She also has a history of colon cancer for which she underwent partial colectomy as well as radiation and chemotherapy in 2008.  She reports having a cardiac catheterization in [place] about a year ago that showed "minor blockage".  Her cardiologist was Dr. Bradly Bienenstock at [facility] control has been mediocre.In 1999 she had a partial hysterectomy, without  oophorectomy.  In 2008 she had colon cancer and underwent partial colectomy, chemotherapy and radiation therapy.  She reports that her mother had 2 open heart surgeries, but does not know why.  Her grandmother also died from heart problems.  There is a strong family history of hypertension and diabetes but as far she knows know what he has been on dialysis or had kidney failure.  Past Medical History:  Diagnosis Date  . Colon cancer ([place])   . Diabetes mellitus without complication ([place])   . Heart palpitations   . Hyperlipidemia   . Hypertension     Past Surgical History:  Procedure Laterality Date  . ABDOMINAL HERNIA REPAIR  2009  . ABDOMINAL HYSTERECTOMY    . CARDIAC CATHETERIZATION  05/09/2016   By [facility] in [place], LAD 40%, CFX diffuse irregularities, LVEDP 14, medical therapy  . COLON SURGERY  2009    Current Medications: Current Meds  Medication Sig  . albuterol (PROVENTIL HFA;VENTOLIN HFA) 108 (90 Base) MCG/ACT inhaler Inhale 2 puffs into the lungs every 6 (six) hours as needed for wheezing or shortness of breath.  Marland Kitchen aspirin 81 MG tablet Take 81 mg by mouth daily.  . celecoxib (CELEBREX) 200 MG capsule Take 200 mg by mouth as needed.   . furosemide (LASIX) 80 MG tablet TAKE 1 AND 1/2 TABLETS BY  MOUTH DAILY  . Magnesium 400 MG TABS Take 400 mg by mouth daily.  . meclizine (  ANTIVERT) 25 MG tablet   . metFORMIN (GLUCOPHAGE-XR) 500 MG 24 hr tablet Take 1,000 mg by mouth daily with breakfast. Take 2 capsules in the evenings  . metoprolol tartrate (LOPRESSOR) 100 MG tablet Take 100 mg by mouth 2 (two) times daily.  . Multiple Vitamin (MULTIVITAMIN WITH MINERALS) TABS tablet Take 1 tablet by mouth daily.  Marland Kitchen omeprazole (PRILOSEC) 40 MG capsule Take 40 mg by mouth daily.  . pravastatin (PRAVACHOL) 40 MG tablet Take 40 mg by mouth daily.  Marland Kitchen spironolactone (ALDACTONE) 25 MG tablet TAKE 1 AND 1/2 TABLETS BY  MOUTH DAILY  . tiZANidine (ZANAFLEX) 4 MG tablet Take 4  mg by mouth 3 (three) times daily as needed.     Allergies:   Aspirin and Penicillins   Social History   Socioeconomic History  . Marital status: Married    Spouse name: Not on file  . Number of children: Not on file  . Years of education: Not on file  . Highest education level: Not on file  Occupational History  . Not on file  Tobacco Use  . Smoking status: Never Smoker  . Smokeless tobacco: Never Used  Substance and Sexual Activity  . Alcohol use: No  . Drug use: No  . Sexual activity: Never  Other Topics Concern  . Not on file  Social History Narrative  . Not on file   Social Determinants of Health   Financial Resource Strain: Not on file  Food Insecurity: Not on file  Transportation Needs: Not on file  Physical Activity: Not on file  Stress: Not on file  Social Connections: Not on file     Family History: The patient's family history includes Asthma in an other family member; Cancer in an other family member; Diabetes in an other family member; Heart disease in an other family member; Hyperlipidemia in an other family member; Hypertension in an other family member.  ROS:   Please see the history of present illness.   All other systems are reviewed and are negative.   EKGs/Labs/Other Studies Reviewed:    ECHO January 17, 2018 - Left ventricle: The cavity size was normal. Wall thickness was   increased in a pattern of mild LVH. Systolic function was normal.   The estimated ejection fraction was in the range of 60% to 65%.   GLS -18.1%. Wall motion was normal; there were no regional wall   motion abnormalities. Doppler parameters are consistent with   abnormal left ventricular relaxation (grade 1 diastolic   dysfunction). The E/e&' ratio is between 8-15, suggesting   indeterminate LV filling pressure. - Mitral valve: Mildly thickened leaflets . There was trivial   regurgitation. - Left atrium: The atrium was normal in size. - Tricuspid valve: There was trivial  regurgitation. - Pulmonary arteries: PA peak pressure: 28 mm Hg (S). - Inferior vena cava: The vessel was normal in size. The   respirophasic diameter changes were in the normal range (= 50%),   consistent with normal central venous pressure.  EKG:  EKG is ordered today.  Personally reviewed, shows normal sinus rhythm and is a completely normal tracing.  QTc 434 ms  Recent Labs: No results found for requested labs within last 8760 hours.  06/08/2020 creatinine 1.25, normal liver function test, hemoglobin A1c 7.9% Recent Lipid Panel No results found for: CHOL, TRIG, HDL, CHOLHDL, VLDL, LDLCALC, LDLDIRECT 06/08/2020 cholesterol 166, HDL 51, LDL 81, triglycerides 201 Physical Exam:    VS:  BP 126/74 (BP Location: Right  Arm, Patient Position: Sitting, Cuff Size: Large)   Pulse 81   Ht 5' 3.5" (1.613 m)   Wt 201 lb (91.2 kg)   SpO2 97%   BMI 35.05 kg/m     Wt Readings from Last 3 Encounters:  01/05/21 201 lb (91.2 kg)  10/28/19 188 lb (85.3 kg)  06/23/18 210 lb 6.4 oz (95.4 kg)      General: Alert, oriented x3, no distress, severely obese Head: no evidence of trauma, PERRL, EOMI, no exophtalmos or lid lag, no myxedema, no xanthelasma; normal ears, nose and oropharynx Neck: normal jugular venous pulsations and no hepatojugular reflux; brisk carotid pulses without delay and no carotid bruits Chest: clear to auscultation, no signs of consolidation by percussion or palpation, normal fremitus, symmetrical and full respiratory excursions Cardiovascular: normal position and quality of the apical impulse, regular rhythm, normal first and second heart sounds, no murmurs, rubs or gallops Abdomen: no tenderness or distention, no masses by palpation, no abnormal pulsatility or arterial bruits, normal bowel sounds, no hepatosplenomegaly Extremities: no clubbing, cyanosis or edema; 2+ radial, ulnar and brachial pulses bilaterally; 2+ right femoral, posterior tibial and dorsalis pedis pulses; 2+  left femoral, posterior tibial and dorsalis pedis pulses; no subclavian or femoral bruits Neurological: grossly nonfocal Psych: Normal mood and affect   ASSESSMENT:    1. Esophageal dysphagia   2. Chronic diastolic heart failure ([place])   3. Essential hypertension   4. Hypercholesterolemia   5. Diabetes mellitus type 2 in obese (HCC)   6. Class 2 severe obesity due to excess calories with serious comorbidity and body mass index (BMI) of 36.0 to 36.9 in adult [facility])    PLAN:    In order of problems listed above:  1. CHF: As far as I can tell clinically euvolemic, although obesity does limit the physical exam.  NYHA functional class I-2.  No need for recent diuretic dose adjustments.  Continue furosemide and spironolactone.  Reminded her of the importance of dietary sodium restriction and weight monitoring. 2. HTN: Very well controlled. 3. HLP: On statin, LDL at target less than 100.  She does not have known CAD or PAD. 4. DM: Borderline control on metformin monotherapy. 5. Obesity: She has managed to lose some weight, but additional weight loss would be beneficial for her metabolic problems and heart failure. 6. Dysphagia: Symptoms are strongly suggestive of esophageal stenosis.  Possible history of a "ring" in the past.  She does not have a gastroenterologist in town but should establish routine gastroenterology follow-up anyway with her history of colon cancer.  We will make the necessary referral.  Medication Adjustments/Labs and Tests Ordered: Current medicines are reviewed at length with the patient today.  Concerns regarding medicines are outlined above.  Orders Placed This Encounter  Procedures  . Ambulatory referral to Gastroenterology  . EKG 12-Lead   No orders of the defined types were placed in this encounter.   Patient Instructions  Medication Instructions:  No changes *If you need a refill on your cardiac medications before your next appointment, please call your  pharmacy*   Lab Work: None ordered If you have labs (blood work) drawn today and your tests are completely normal, you will receive your results only by: Marland Kitchen MyChart Message (if you have MyChart) OR . A paper copy in the mail If you have any lab test that is abnormal or we need to change your treatment, we will call you to review the results.   Testing/Procedures: None ordered  Follow-Up: At [facility], you and your health needs are our priority.  As part of our continuing mission to provide you with exceptional heart care, we have created designated Provider Care Teams.  These Care Teams include your primary Cardiologist (physician) and Advanced Practice Providers (APPs -  Physician Assistants and Nurse Practitioners) who all work together to provide you with the care you need, when you need it.  We recommend signing up for the patient portal called "MyChart".  Sign up information is provided on this After Visit Summary.  MyChart is used to connect with patients for Virtual Visits (Telemedicine).  Patients are able to view lab/test results, encounter notes, upcoming appointments, etc.  Non-urgent messages can be sent to your provider as well.   To learn more about what you can do with MyChart, go to [email or web address].    Your next appointment:   12 month(s)  The format for your next appointment:   In Person  Provider:   You may see Sanda Klein, MD or one of the following Advanced Practice Providers on your designated Care Team:    Almyra Deforest, PA-C  Fabian Sharp, [place] or   Roby Lofts, [place]    Other Instructions A referral has been made to [facility], Dr. Paulita Fujita.      Signed, Sanda Klein, MD  01/05/2021 11:20 AM    [place]

## 2021-01-05 NOTE — Patient Instructions (Signed)
Medication Instructions:  No changes *If you need a refill on your cardiac medications before your next appointment, please call your pharmacy*   Lab Work: None ordered If you have labs (blood work) drawn today and your tests are completely normal, you will receive your results only by: Marland Kitchen MyChart Message (if you have MyChart) OR . A paper copy in the mail If you have any lab test that is abnormal or we need to change your treatment, we will call you to review the results.   Testing/Procedures: None ordered   Follow-Up: At [facility], you and your health needs are our priority.  As part of our continuing mission to provide you with exceptional heart care, we have created designated Provider Care Teams.  These Care Teams include your primary Cardiologist (physician) and Advanced Practice Providers (APPs -  Physician Assistants and Nurse Practitioners) who all work together to provide you with the care you need, when you need it.  We recommend signing up for the patient portal called "MyChart".  Sign up information is provided on this After Visit Summary.  MyChart is used to connect with patients for Virtual Visits (Telemedicine).  Patients are able to view lab/test results, encounter notes, upcoming appointments, etc.  Non-urgent messages can be sent to your provider as well.   To learn more about what you can do with MyChart, go to [email or web address].    Your next appointment:   12 month(s)  The format for your next appointment:   In Person  Provider:   You may see Sanda Klein, MD or one of the following Advanced Practice Providers on your designated Care Team:    Almyra Deforest, PA-C  Fabian Sharp, [place] or   Roby Lofts, [place]    Other Instructions A referral has been made to [facility], Dr. Paulita Fujita.

## 2021-02-22 ENCOUNTER — Telehealth: Payer: Self-pay | Admitting: Internal Medicine

## 2021-02-22 ENCOUNTER — Other Ambulatory Visit: Payer: Self-pay | Admitting: Internal Medicine

## 2021-02-22 DIAGNOSIS — Z1231 Encounter for screening mammogram for malignant neoplasm of breast: Secondary | ICD-10-CM

## 2021-02-22 NOTE — Telephone Encounter (Signed)
   Crystal Holt DOB: 06/21/1951 MRN: [id number]   RIDER WAIVER AND RELEASE OF LIABILITY  For purposes of improving physical access to our facilities, [facility] is pleased to partner with third parties to provide [place] patients or other authorized individuals the option of convenient, on-demand ground transportation services (the [place]") through use of the technology service that enables users to request on-demand ground transportation from independent third-party providers.  By opting to use and accept these [employer], I, the undersigned, hereby agree on behalf of myself, and on behalf of any minor child using the Government social research officer for whom I am the parent or legal guardian, as follows:  Government social research officer provided to me are provided by independent third-party transportation providers who are not [employer] or employees and who are unaffiliated with [employer]. [place] is neither a transportation carrier nor a common or public carrier. [facility] has no control over the quality or safety of the transportation that occurs as a result of the [employer]. [facility] cannot guarantee that any third-party transportation provider will complete any arranged transportation service. [facility] makes no representation, warranty, or guarantee regarding the reliability, timeliness, quality, safety, suitability, or availability of any of the Transport Services or that they will be error free. I fully understand that traveling by vehicle involves risks and dangers of serious bodily injury, including permanent disability, paralysis, and death. I agree, on behalf of myself and on behalf of any minor child using the Transport Services for whom I am the parent or legal guardian, that the entire risk arising out of my use of the [employer] remains solely with me, to the maximum extent permitted under applicable law. The [employer] are provided "as  is" and "as available." [facility] disclaims all representations and warranties, express, implied or statutory, not expressly set out in these terms, including the implied warranties of merchantability and fitness for a particular purpose. I hereby waive and release [facility], its agents, employees, officers, directors, representatives, insurers, attorneys, assigns, successors, subsidiaries, and affiliates from any and all past, present, or future claims, demands, liabilities, actions, causes of action, or suits of any kind directly or indirectly arising from acceptance and use of the [employer]. I further waive and release [facility] and its affiliates from all present and future liability and responsibility for any injury or death to persons or damages to property caused by or related to the use of the [employer]. I have read this Waiver and Release of Liability, and I understand the terms used in it and their legal significance. This Waiver is freely and voluntarily given with the understanding that my right (as well as the right of any minor child for whom I am the parent or legal guardian using the [employer]) to legal recourse against [facility] in connection with the [employer] is knowingly surrendered in return for use of these services.   I attest that I read the consent document to Crystal Holt, gave Ms. Garriga the opportunity to ask questions and answered the questions asked (if any). I affirm that Crystal Holt then provided consent for she's participation in this program.     Drucie Ip

## 2021-03-03 ENCOUNTER — Inpatient Hospital Stay: Admission: RE | Admit: 2021-03-03 | Payer: Medicare Other | Source: Ambulatory Visit

## 2021-03-08 ENCOUNTER — Ambulatory Visit: Payer: Medicare Other

## 2021-04-03 DIAGNOSIS — I5032 Chronic diastolic (congestive) heart failure: Secondary | ICD-10-CM | POA: Diagnosis not present

## 2021-04-03 DIAGNOSIS — R5383 Other fatigue: Secondary | ICD-10-CM | POA: Diagnosis not present

## 2021-04-03 DIAGNOSIS — E118 Type 2 diabetes mellitus with unspecified complications: Secondary | ICD-10-CM | POA: Diagnosis not present

## 2021-04-03 DIAGNOSIS — M25562 Pain in left knee: Secondary | ICD-10-CM | POA: Diagnosis not present

## 2021-04-03 DIAGNOSIS — I10 Essential (primary) hypertension: Secondary | ICD-10-CM | POA: Diagnosis not present

## 2021-04-03 DIAGNOSIS — I130 Hypertensive heart and chronic kidney disease with heart failure and stage 1 through stage 4 chronic kidney disease, or unspecified chronic kidney disease: Secondary | ICD-10-CM | POA: Diagnosis not present

## 2021-04-03 DIAGNOSIS — E1121 Type 2 diabetes mellitus with diabetic nephropathy: Secondary | ICD-10-CM | POA: Diagnosis not present

## 2021-04-03 DIAGNOSIS — E782 Mixed hyperlipidemia: Secondary | ICD-10-CM | POA: Diagnosis not present

## 2021-04-03 DIAGNOSIS — N1831 Chronic kidney disease, stage 3a: Secondary | ICD-10-CM | POA: Diagnosis not present

## 2021-04-03 DIAGNOSIS — I129 Hypertensive chronic kidney disease with stage 1 through stage 4 chronic kidney disease, or unspecified chronic kidney disease: Secondary | ICD-10-CM | POA: Diagnosis not present

## 2021-04-03 DIAGNOSIS — Z0000 Encounter for general adult medical examination without abnormal findings: Secondary | ICD-10-CM | POA: Diagnosis not present

## 2021-04-05 DIAGNOSIS — M25562 Pain in left knee: Secondary | ICD-10-CM | POA: Diagnosis not present

## 2021-04-05 DIAGNOSIS — I10 Essential (primary) hypertension: Secondary | ICD-10-CM | POA: Diagnosis not present

## 2021-04-05 DIAGNOSIS — M25559 Pain in unspecified hip: Secondary | ICD-10-CM | POA: Diagnosis not present

## 2021-04-05 DIAGNOSIS — M25569 Pain in unspecified knee: Secondary | ICD-10-CM | POA: Diagnosis not present

## 2021-04-05 DIAGNOSIS — M1990 Unspecified osteoarthritis, unspecified site: Secondary | ICD-10-CM | POA: Diagnosis not present

## 2021-04-05 DIAGNOSIS — E119 Type 2 diabetes mellitus without complications: Secondary | ICD-10-CM | POA: Diagnosis not present

## 2021-04-05 DIAGNOSIS — M549 Dorsalgia, unspecified: Secondary | ICD-10-CM | POA: Diagnosis not present

## 2021-04-05 DIAGNOSIS — N289 Disorder of kidney and ureter, unspecified: Secondary | ICD-10-CM | POA: Diagnosis not present

## 2021-04-05 DIAGNOSIS — M5136 Other intervertebral disc degeneration, lumbar region: Secondary | ICD-10-CM | POA: Diagnosis not present

## 2021-05-11 ENCOUNTER — Other Ambulatory Visit: Payer: Self-pay | Admitting: Cardiovascular Disease

## 2021-09-27 DIAGNOSIS — E782 Mixed hyperlipidemia: Secondary | ICD-10-CM | POA: Diagnosis not present

## 2021-09-27 DIAGNOSIS — E118 Type 2 diabetes mellitus with unspecified complications: Secondary | ICD-10-CM | POA: Diagnosis not present

## 2022-02-23 DIAGNOSIS — R5383 Other fatigue: Secondary | ICD-10-CM | POA: Diagnosis not present

## 2022-02-23 DIAGNOSIS — Z638 Other specified problems related to primary support group: Secondary | ICD-10-CM | POA: Diagnosis not present

## 2022-02-23 DIAGNOSIS — R0602 Shortness of breath: Secondary | ICD-10-CM | POA: Diagnosis not present

## 2022-04-13 ENCOUNTER — Other Ambulatory Visit: Payer: Self-pay | Admitting: Cardiovascular Disease

## 2022-05-07 DIAGNOSIS — Z23 Encounter for immunization: Secondary | ICD-10-CM | POA: Diagnosis not present

## 2022-05-07 DIAGNOSIS — E1165 Type 2 diabetes mellitus with hyperglycemia: Secondary | ICD-10-CM | POA: Diagnosis not present

## 2022-05-07 DIAGNOSIS — Z79899 Other long term (current) drug therapy: Secondary | ICD-10-CM | POA: Diagnosis not present

## 2022-05-07 DIAGNOSIS — I110 Hypertensive heart disease with heart failure: Secondary | ICD-10-CM | POA: Diagnosis not present

## 2022-05-07 DIAGNOSIS — Z85038 Personal history of other malignant neoplasm of large intestine: Secondary | ICD-10-CM | POA: Diagnosis not present

## 2022-05-07 DIAGNOSIS — E785 Hyperlipidemia, unspecified: Secondary | ICD-10-CM | POA: Diagnosis not present

## 2022-05-07 DIAGNOSIS — Z0000 Encounter for general adult medical examination without abnormal findings: Secondary | ICD-10-CM | POA: Diagnosis not present

## 2022-05-07 DIAGNOSIS — Z8673 Personal history of transient ischemic attack (TIA), and cerebral infarction without residual deficits: Secondary | ICD-10-CM | POA: Diagnosis not present

## 2022-05-10 ENCOUNTER — Ambulatory Visit: Payer: Medicare Other | Admitting: Cardiovascular Disease

## 2022-05-14 ENCOUNTER — Ambulatory Visit: Payer: Medicare Other | Admitting: Nurse Practitioner

## 2022-05-14 NOTE — Progress Notes (Deleted)
Office Visit    Patient Name: Crystal Holt Date of Encounter: 05/14/2022  Primary Care Provider:  Merrilee Seashore, MD Primary Cardiologist:  Sanda Klein, MD  Chief Complaint    71 year old female with a history of chronic diastolic heart failure, hypertension, hyperlipidemia, palpitations, type 2 diabetes, obesity, and colon cancer who presents for follow-up related to heart failure.   Past Medical History    Past Medical History:  Diagnosis Date   Colon cancer ([place])    Diabetes mellitus without complication ([place])    Heart palpitations    Hyperlipidemia    Hypertension    Past Surgical History:  Procedure Laterality Date   ABDOMINAL HERNIA REPAIR  2009   ABDOMINAL HYSTERECTOMY     CARDIAC CATHETERIZATION  05/09/2016   By [facility] in [place], LAD 40%, CFX diffuse irregularities, LVEDP 14, medical therapy   COLON SURGERY  2009    Allergies  Allergies  Allergen Reactions   Aspirin    Penicillins Rash    History of Present Illness    71 year old female with the above past medical history including chronic diastolic heart failure, hypertension, hyperlipidemia, palpitations, type 2 diabetes, obesity, and colon cancer.  Prior cardiac catheterization in [place] showed nonobstructive CAD.  Most recent echocardiogram in 01/2018 showed EF 60 to 65%, mild LVH, G1 DD, no significant valvular abnormalities.  Last seen in the office on 01/05/2021 and was stable from a cardiac standpoint.  She noted some mild dysphagia.  She was referred to GI.  She denies symptoms concerning for angina.  She presents today for follow-up.  Since her last visit  Chronic diastolic heart failure: Hypertension: Hyperlipidemia: Type 2 diabetes: Obesity: Dysphagia: Disposition:  Home Medications    Current Outpatient Medications  Medication Sig Dispense Refill   albuterol (PROVENTIL HFA;VENTOLIN HFA) 108 (90 Base) MCG/ACT inhaler Inhale 2 puffs into the lungs every  6 (six) hours as needed for wheezing or shortness of breath.     aspirin 81 MG tablet Take 81 mg by mouth daily.     celecoxib (CELEBREX) 200 MG capsule Take 200 mg by mouth as needed.      furosemide (LASIX) 80 MG tablet TAKE 1 AND 1/2 TABLETS BY  MOUTH DAILY 45 tablet 0   Magnesium 400 MG TABS Take 400 mg by mouth daily.     meclizine (ANTIVERT) 25 MG tablet      metFORMIN (GLUCOPHAGE-XR) 500 MG 24 hr tablet Take 1,000 mg by mouth daily with breakfast. Take 2 capsules in the evenings     metoprolol tartrate (LOPRESSOR) 100 MG tablet Take 100 mg by mouth 2 (two) times daily.     Multiple Vitamin (MULTIVITAMIN WITH MINERALS) TABS tablet Take 1 tablet by mouth daily.     omeprazole (PRILOSEC) 40 MG capsule Take 40 mg by mouth daily.  3   pravastatin (PRAVACHOL) 40 MG tablet Take 40 mg by mouth daily.     spironolactone (ALDACTONE) 25 MG tablet TAKE 1 AND 1/2 TABLETS BY  MOUTH DAILY 45 tablet 0   tiZANidine (ZANAFLEX) 4 MG tablet Take 4 mg by mouth 3 (three) times daily as needed.     No current facility-administered medications for this visit.     Review of Systems    ***.  All other systems reviewed and are otherwise negative except as noted above.    Physical Exam    VS:  There were no vitals taken for this visit. , BMI There is no  height or weight on file to calculate BMI.     GEN: Well nourished, well developed, in no acute distress. HEENT: normal. Neck: Supple, no JVD, carotid bruits, or masses. Cardiac: RRR, no murmurs, rubs, or gallops. No clubbing, cyanosis, edema.  Radials/DP/PT 2+ and equal bilaterally.  Respiratory:  Respirations regular and unlabored, clear to auscultation bilaterally. GI: Soft, nontender, nondistended, BS + x 4. MS: no deformity or atrophy. Skin: warm and dry, no rash. Neuro:  Strength and sensation are intact. Psych: Normal affect.  Accessory Clinical Findings    ECG personally reviewed by me today - *** - no acute changes.   Lab Results   Component Value Date   WBC 8.7 11/30/2012   HGB 12.9 11/30/2012   HCT 37.9 11/30/2012   MCV 86.9 11/30/2012   PLT 412 (H) 11/30/2012   Lab Results  Component Value Date   CREATININE 1.14 (H) 07/16/2018   BUN 19 07/16/2018   NA 139 07/16/2018   K 3.5 07/16/2018   CL 97 07/16/2018   CO2 25 07/16/2018   Lab Results  Component Value Date   ALT 23 02/28/2009   AST 28 02/28/2009   ALKPHOS 59 02/28/2009   BILITOT 0.5 02/28/2009   No results found for: "CHOL", "HDL", "LDLCALC", "LDLDIRECT", "TRIG", "CHOLHDL"  No results found for: "HGBA1C"  Assessment & Plan    1.  ***  No BP recorded.  {Refresh Note OR Click here to enter BP  :1}***   Lenna Sciara, NP 05/14/2022, 4:52 AM

## 2022-05-27 ENCOUNTER — Other Ambulatory Visit: Payer: Self-pay | Admitting: Cardiovascular Disease

## 2022-07-28 ENCOUNTER — Other Ambulatory Visit: Payer: Self-pay | Admitting: Cardiovascular Disease

## 2022-07-31 ENCOUNTER — Ambulatory Visit: Payer: Medicare Other | Admitting: Cardiovascular Disease

## 2022-11-12 ENCOUNTER — Ambulatory Visit: Payer: Medicare HMO | Attending: Nurse Practitioner | Admitting: Cardiovascular Disease

## 2023-07-20 ENCOUNTER — Other Ambulatory Visit: Payer: Self-pay

## 2023-07-20 ENCOUNTER — Encounter (HOSPITAL_COMMUNITY): Payer: Self-pay | Admitting: *Deleted

## 2023-07-20 ENCOUNTER — Emergency Department (HOSPITAL_COMMUNITY)
Admission: EM | Admit: 2023-07-20 | Discharge: 2023-07-20 | Disposition: A | Discharge status: Home / Self Care | Payer: Medicare HMO | Attending: Emergency Medicine | Admitting: Emergency Medicine

## 2023-07-20 ENCOUNTER — Emergency Department (HOSPITAL_COMMUNITY): Payer: Medicare HMO

## 2023-07-20 DIAGNOSIS — Z7984 Long term (current) use of oral hypoglycemic drugs: Secondary | ICD-10-CM | POA: Insufficient documentation

## 2023-07-20 DIAGNOSIS — I10 Essential (primary) hypertension: Secondary | ICD-10-CM | POA: Insufficient documentation

## 2023-07-20 DIAGNOSIS — R519 Headache, unspecified: Secondary | ICD-10-CM | POA: Diagnosis not present

## 2023-07-20 DIAGNOSIS — R1032 Left lower quadrant pain: Secondary | ICD-10-CM | POA: Diagnosis not present

## 2023-07-20 DIAGNOSIS — Z85038 Personal history of other malignant neoplasm of large intestine: Secondary | ICD-10-CM | POA: Diagnosis not present

## 2023-07-20 DIAGNOSIS — Z7982 Long term (current) use of aspirin: Secondary | ICD-10-CM | POA: Insufficient documentation

## 2023-07-20 DIAGNOSIS — Z79899 Other long term (current) drug therapy: Secondary | ICD-10-CM | POA: Diagnosis not present

## 2023-07-20 DIAGNOSIS — E119 Type 2 diabetes mellitus without complications: Secondary | ICD-10-CM | POA: Diagnosis not present

## 2023-07-20 DIAGNOSIS — R109 Unspecified abdominal pain: Secondary | ICD-10-CM

## 2023-07-20 DIAGNOSIS — R1030 Lower abdominal pain, unspecified: Secondary | ICD-10-CM | POA: Insufficient documentation

## 2023-07-20 LAB — CBC
HCT: 39.3 % (ref 36.0–46.0)
Hemoglobin: 12.6 g/dL (ref 12.0–15.0)
MCH: 28.8 pg (ref 26.0–34.0)
MCHC: 32.1 g/dL (ref 30.0–36.0)
MCV: 89.9 fL (ref 80.0–100.0)
Platelets: 367 10*3/uL (ref 150–400)
RBC: 4.37 MIL/uL (ref 3.87–5.11)
RDW: 13.6 % (ref 11.5–15.5)
WBC: 7.7 10*3/uL (ref 4.0–10.5)
nRBC: 0 % (ref 0.0–0.2)

## 2023-07-20 LAB — URINALYSIS, ROUTINE W REFLEX MICROSCOPIC
Bacteria, UA: NONE SEEN
Bilirubin Urine: NEGATIVE
Glucose, UA: NEGATIVE mg/dL
Hgb urine dipstick: NEGATIVE
Ketones, ur: NEGATIVE mg/dL
Nitrite: NEGATIVE
Protein, ur: NEGATIVE mg/dL
Specific Gravity, Urine: 1.004 — ABNORMAL LOW (ref 1.005–1.030)
pH: 6 (ref 5.0–8.0)

## 2023-07-20 LAB — COMPREHENSIVE METABOLIC PANEL
ALT: 20 U/L (ref 0–44)
AST: 22 U/L (ref 15–41)
Albumin: 3.9 g/dL (ref 3.5–5.0)
Alkaline Phosphatase: 51 U/L (ref 38–126)
Anion gap: 12 (ref 5–15)
BUN: 18 mg/dL (ref 8–23)
CO2: 26 mmol/L (ref 22–32)
Calcium: 9.6 mg/dL (ref 8.9–10.3)
Chloride: 97 mmol/L — ABNORMAL LOW (ref 98–111)
Creatinine, Ser: 1.51 mg/dL — ABNORMAL HIGH (ref 0.44–1.00)
GFR, Estimated: 36 mL/min — ABNORMAL LOW (ref >60)
Glucose, Bld: 123 mg/dL — ABNORMAL HIGH (ref 70–99)
Potassium: 3.6 mmol/L (ref 3.5–5.1)
Sodium: 135 mmol/L (ref 135–145)
Total Bilirubin: 0.6 mg/dL (ref <1.2)
Total Protein: 8 g/dL (ref 6.5–8.1)

## 2023-07-20 LAB — LIPASE, BLOOD: Lipase: 41 U/L (ref 11–51)

## 2023-07-20 MED ORDER — ONDANSETRON HCL 4 MG/2ML IJ SOLN
4.0000 mg | Freq: Once | INTRAMUSCULAR | Status: AC
  Administered 2023-07-20: 4 mg via INTRAVENOUS
  Filled 2023-07-20: qty 2

## 2023-07-20 MED ORDER — POLYETHYLENE GLYCOL 3350 17 G PO PACK: 17.0000 g | PACK | Freq: Two times a day (BID) | ORAL | 0 refills | Status: AC

## 2023-07-20 MED ORDER — MORPHINE SULFATE (PF) 4 MG/ML IV SOLN
4.0000 mg | Freq: Once | INTRAVENOUS | Status: AC
  Administered 2023-07-20: 4 mg via INTRAVENOUS
  Filled 2023-07-20: qty 1

## 2023-07-20 NOTE — ED Triage Notes (Signed)
The pt is c/o abd and back pain for one week   she  saw a doctor on Tuesday for knee pain and she was given a narcotic pain med  she reports that she has constipation

## 2023-07-20 NOTE — ED Provider Notes (Signed)
[place] EMERGENCY DEPARTMENT AT [facility] Provider Note   CSN: [id number] Arrival date & time: 07/20/23  0865     History {Add pertinent medical, surgical, social history, OB history to HPI:1} Chief Complaint  Patient presents with   Abdominal Pain    Crystal Holt is a 72 y.o. female.   Abdominal Pain    Patient has a history of colon cancer, hypertension, hyper lipidemia, diabetes, diverticulitis, prior history of hysterectomy abdominal hernia repair and colon surgery who presents ED with complaints of abdominal pain.  Patient states she has had the symptoms for the last few days.  It is in her lower abdomen and radiates towards her back.  She has been feeling somewhat constipated the last couple of days.  She did feel like she had to strain.  Patient was recently started on pain medications for knee pain.  Patient denies any fevers.  No vomiting.  She has had a bit of a headache and is felt lightheaded at times.  Home Medications Prior to Admission medications   Medication Sig Start Date End Date Taking? Authorizing Provider  albuterol (PROVENTIL HFA;VENTOLIN HFA) 108 (90 Base) MCG/ACT inhaler Inhale 2 puffs into the lungs every 6 (six) hours as needed for wheezing or shortness of breath.    [provider]  aspirin 81 MG tablet Take 81 mg by mouth daily.    [provider]  celecoxib (CELEBREX) 200 MG capsule Take 200 mg by mouth as needed.  09/09/19   [provider]  furosemide (LASIX) 80 MG tablet TAKE 1 AND 1/2 TABLETS BY MOUTH  DAILY 07/30/22   Croitoru, Mihai, MD  Magnesium 400 MG TABS Take 400 mg by mouth daily.    [provider]  meclizine (ANTIVERT) 25 MG tablet  03/24/20   [provider]  metFORMIN (GLUCOPHAGE-XR) 500 MG 24 hr tablet Take 1,000 mg by mouth daily with breakfast. Take 2 capsules in the evenings    [provider]  metoprolol tartrate (LOPRESSOR) 100 MG tablet Take 100 mg by mouth 2  (two) times daily.    [provider]  Multiple Vitamin (MULTIVITAMIN WITH MINERALS) TABS tablet Take 1 tablet by mouth daily.    [provider]  omeprazole (PRILOSEC) 40 MG capsule Take 40 mg by mouth daily. 12/16/17   [provider]  pravastatin (PRAVACHOL) 40 MG tablet Take 40 mg by mouth daily.    [provider]  spironolactone (ALDACTONE) 25 MG tablet TAKE 1 AND 1/2 TABLETS BY MOUTH  DAILY 07/30/22   Croitoru, Mihai, MD  tiZANidine (ZANAFLEX) 4 MG tablet Take 4 mg by mouth 3 (three) times daily as needed. 05/20/19   [provider]      Allergies    Aspirin and Penicillins    Review of Systems   Review of Systems  Gastrointestinal:  Positive for abdominal pain.    Physical Exam Updated Vital Signs BP (!) 145/75 (BP Location: Right Arm)   Pulse 63   Temp 97.9 F (36.6 C)   Resp 16   Ht 1.6 m (5\' 3" )   Wt 91.2 kg   SpO2 96%   BMI 35.62 kg/m  Physical Exam Vitals and nursing note reviewed.  Constitutional:      General: She is not in acute distress.    Appearance: She is well-developed. She is not diaphoretic.  HENT:     Head: Normocephalic and atraumatic.     Right Ear: External ear normal.  Left Ear: External ear normal.  Eyes:     General: No scleral icterus.       Right eye: No discharge.        Left eye: No discharge.     Conjunctiva/sclera: Conjunctivae normal.  Neck:     Trachea: No tracheal deviation.  Cardiovascular:     Rate and Rhythm: Normal rate and regular rhythm.  Pulmonary:     Effort: Pulmonary effort is normal. No respiratory distress.     Breath sounds: Normal breath sounds. No stridor. No wheezing or rales.  Abdominal:     General: Bowel sounds are normal. There is no distension.     Palpations: Abdomen is soft.     Tenderness: There is abdominal tenderness in the left lower quadrant. There is no guarding or rebound.     Hernia: No hernia is present.  Musculoskeletal:        General: No  tenderness or deformity.     Cervical back: Neck supple.  Skin:    General: Skin is warm and dry.     Findings: No rash.  Neurological:     General: No focal deficit present.     Mental Status: She is alert.     Cranial Nerves: No cranial nerve deficit, dysarthria or facial asymmetry.     Sensory: No sensory deficit.     Motor: No abnormal muscle tone or seizure activity.     Coordination: Coordination normal.  Psychiatric:        Mood and Affect: Mood normal.     ED Results / Procedures / Treatments   Labs (all labs ordered are listed, but only abnormal results are displayed) Labs Reviewed  CBC  LIPASE, BLOOD  COMPREHENSIVE METABOLIC PANEL  URINALYSIS, ROUTINE W REFLEX MICROSCOPIC    EKG None  Radiology No results found.  Procedures Procedures  {Document cardiac monitor, telemetry assessment procedure when appropriate:1}  Medications Ordered in ED Medications  morphine (PF) 4 MG/ML injection 4 mg (has no administration in time range)  ondansetron (ZOFRAN) injection 4 mg (has no administration in time range)    ED Course/ Medical Decision Making/ A&P   {   Click here for ABCD2, HEART and other calculatorsREFRESH Note before signing :1}                              Medical Decision Making Amount and/or Complexity of Data Reviewed Labs: ordered.  Risk Prescription drug management.   ***  {Document critical care time when appropriate:1} {Document review of labs and clinical decision tools ie heart score, Chads2Vasc2 etc:1}  {Document your independent review of radiology images, and any outside records:1} {Document your discussion with family members, caretakers, and with consultants:1} {Document social determinants of health affecting pt's care:1} {Document your decision making why or why not admission, treatments were needed:1} Final Clinical Impression(s) / ED Diagnoses Final diagnoses:  None    Rx / DC Orders ED Discharge Orders     None

## 2023-07-20 NOTE — Discharge Instructions (Signed)
The tests today in the ED were reassuring.  The CAT scan did not show any signs of infection or blockage.  Take the stool softeners to see if that helps with the abdominal pain you have been experiencing.  Follow-up with your doctor this week to be rechecked.  Return to ED for fevers chills or other concerning symptoms

## 2023-08-09 ENCOUNTER — Other Ambulatory Visit: Payer: Self-pay | Admitting: Student

## 2023-08-09 DIAGNOSIS — Z1231 Encounter for screening mammogram for malignant neoplasm of breast: Secondary | ICD-10-CM
